# Patient Record
Sex: Female | Born: 1980 | Race: Black or African American | Hispanic: No | Marital: Single | Smoking: Current every day smoker
Health system: Southern US, Community
[De-identification: ages and names within clinical notes are randomized; demographics above are authoritative.]

## PROBLEM LIST (undated history)

## (undated) DIAGNOSIS — K219 Gastro-esophageal reflux disease without esophagitis: Secondary | ICD-10-CM

## (undated) HISTORY — DX: Gastro-esophageal reflux disease without esophagitis: K21.9

---

## 2003-08-17 ENCOUNTER — Emergency Department (HOSPITAL_COMMUNITY): Admission: EM | Admit: 2003-08-17 | Discharge: 2003-08-18 | Payer: Self-pay

## 2008-08-13 ENCOUNTER — Emergency Department (HOSPITAL_COMMUNITY): Admission: EM | Admit: 2008-08-13 | Discharge: 2008-08-13 | Payer: Self-pay | Admitting: Emergency Medicine

## 2010-10-12 LAB — URINALYSIS, ROUTINE W REFLEX MICROSCOPIC
Glucose, UA: NEGATIVE mg/dL
Nitrite: NEGATIVE
Specific Gravity, Urine: 1.013 (ref 1.005–1.030)
pH: 5.5 (ref 5.0–8.0)

## 2010-10-12 LAB — URINE MICROSCOPIC-ADD ON

## 2010-10-12 LAB — URINE CULTURE

## 2010-10-12 LAB — POCT PREGNANCY, URINE: Preg Test, Ur: NEGATIVE

## 2013-04-15 ENCOUNTER — Emergency Department (HOSPITAL_BASED_OUTPATIENT_CLINIC_OR_DEPARTMENT_OTHER)
Admission: EM | Admit: 2013-04-15 | Discharge: 2013-04-15 | Disposition: A | Discharge status: Home / Self Care | Payer: No Typology Code available for payment source | Attending: Emergency Medicine | Admitting: Emergency Medicine

## 2013-04-15 ENCOUNTER — Encounter (HOSPITAL_BASED_OUTPATIENT_CLINIC_OR_DEPARTMENT_OTHER): Payer: Self-pay | Admitting: Emergency Medicine

## 2013-04-15 DIAGNOSIS — Y92410 Unspecified street and highway as the place of occurrence of the external cause: Secondary | ICD-10-CM | POA: Insufficient documentation

## 2013-04-15 DIAGNOSIS — F17200 Nicotine dependence, unspecified, uncomplicated: Secondary | ICD-10-CM | POA: Insufficient documentation

## 2013-04-15 DIAGNOSIS — Y9389 Activity, other specified: Secondary | ICD-10-CM | POA: Insufficient documentation

## 2013-04-15 DIAGNOSIS — S0990XA Unspecified injury of head, initial encounter: Secondary | ICD-10-CM | POA: Insufficient documentation

## 2013-04-15 MED ORDER — IBUPROFEN 800 MG PO TABS: 800.0000 mg | ORAL_TABLET | Freq: Three times a day (TID) | ORAL | Status: DC

## 2013-04-15 MED ORDER — CYCLOBENZAPRINE HCL 10 MG PO TABS: 10.0000 mg | ORAL_TABLET | Freq: Three times a day (TID) | ORAL | Status: DC | PRN
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
## 2013-04-26 ENCOUNTER — Encounter (HOSPITAL_BASED_OUTPATIENT_CLINIC_OR_DEPARTMENT_OTHER): Payer: Self-pay | Admitting: Emergency Medicine

## 2013-04-26 ENCOUNTER — Emergency Department (HOSPITAL_BASED_OUTPATIENT_CLINIC_OR_DEPARTMENT_OTHER)
Admission: EM | Admit: 2013-04-26 | Discharge: 2013-04-26 | Disposition: A | Discharge status: Home / Self Care | Payer: Self-pay | Attending: Emergency Medicine | Admitting: Emergency Medicine

## 2013-04-26 DIAGNOSIS — K0889 Other specified disorders of teeth and supporting structures: Secondary | ICD-10-CM

## 2013-04-26 DIAGNOSIS — F17200 Nicotine dependence, unspecified, uncomplicated: Secondary | ICD-10-CM | POA: Insufficient documentation

## 2013-04-26 DIAGNOSIS — K089 Disorder of teeth and supporting structures, unspecified: Secondary | ICD-10-CM | POA: Insufficient documentation

## 2013-04-26 DIAGNOSIS — Z791 Long term (current) use of non-steroidal anti-inflammatories (NSAID): Secondary | ICD-10-CM | POA: Insufficient documentation

## 2013-04-26 DIAGNOSIS — K029 Dental caries, unspecified: Secondary | ICD-10-CM | POA: Insufficient documentation

## 2013-04-26 MED ORDER — AMOXICILLIN 500 MG PO CAPS: 500.0000 mg | ORAL_CAPSULE | Freq: Three times a day (TID) | ORAL | Status: DC

## 2013-04-26 NOTE — ED Notes (Signed)
Right lower toothache x 2 days. 

## 2013-04-26 NOTE — ED Provider Notes (Signed)
CSN: [id number]     Arrival date & time 04/26/13  1208 History   First MD Initiated Contact with Patient 04/26/13 1221     Chief Complaint  Patient presents with  . Dental Pain   (Consider location/radiation/quality/duration/timing/severity/associated sxs/prior Treatment) Patient is a 32 y.o. female presenting with tooth pain. The history is provided by the patient. No language interpreter was used.  Dental Pain Location:  Lower Lower teeth location:  31/RL 2nd molar Quality:  Dull Severity:  No pain Onset quality:  Sudden Timing:  Constant Progression:  Worsening Chronicity:  New Previous work-up:  Dental exam Worsened by:  Nothing tried Ineffective treatments:  None tried   History reviewed. No pertinent past medical history. History reviewed. No pertinent past surgical history. No family history on file. History  Substance Use Topics  . Smoking status: Current Every Day Smoker    Types: Cigarettes  . Smokeless tobacco: Not on file  . Alcohol Use: No   OB History   Grav Para Term Preterm Abortions TAB SAB Ect Mult Living                 Review of Systems  All other systems reviewed and are negative.    Allergies  Review of patient's allergies indicates no known allergies.  Home Medications   Current Outpatient Rx  Name  Route  Sig  Dispense  Refill  . TRAMADOL HCL PO   Oral   Take by mouth.         Marland Kitchen amoxicillin (AMOXIL) 500 MG capsule   Oral   Take 1 capsule (500 mg total) by mouth 3 (three) times daily.   21 capsule   0   . cyclobenzaprine (FLEXERIL) 10 MG tablet   Oral   Take 1 tablet (10 mg total) by mouth 3 (three) times daily as needed for muscle spasms.   30 tablet   0   . ibuprofen (ADVIL,MOTRIN) 800 MG tablet   Oral   Take 1 tablet (800 mg total) by mouth 3 (three) times daily.   21 tablet   0    BP 127/77  Pulse 74  Temp(Src) 98.4 F (36.9 C) (Oral)  Resp 16  Ht 5\' 4"  (1.626 m)  Wt 195 lb (88.451 kg)  BMI 33.46 kg/m2   SpO2 100%  LMP 03/31/2013 Physical Exam  Nursing note and vitals reviewed. Constitutional: She appears well-developed and well-nourished.  HENT:  Head: Normocephalic.  Right Ear: External ear normal.  Decayed tooth  Eyes: Pupils are equal, round, and reactive to light.  Neck: Normal range of motion.  Cardiovascular: Normal rate.   Pulmonary/Chest: Effort normal.  Musculoskeletal: Normal range of motion.  Neurological: She is alert.  Skin: Skin is warm.    ED Course  Procedures (including critical care time) Labs Review Labs Reviewed - No data to display Imaging Review No results found.  EKG Interpretation   None       MDM   1. Toothache    amoxicillian follow up with Dr. Tamela Gammon, PA-C 04/26/13 1304

## 2013-04-28 NOTE — ED Provider Notes (Signed)
Medical screening examination/treatment/procedure(s) were performed by non-physician practitioner and as supervising physician I was immediately available for consultation/collaboration.   Orlin Kann L Jayshon Dommer, MD 04/28/13 1101 

## 2013-07-29 ENCOUNTER — Encounter (HOSPITAL_BASED_OUTPATIENT_CLINIC_OR_DEPARTMENT_OTHER): Payer: Self-pay | Admitting: Emergency Medicine

## 2013-07-29 ENCOUNTER — Emergency Department (HOSPITAL_BASED_OUTPATIENT_CLINIC_OR_DEPARTMENT_OTHER)
Admission: EM | Admit: 2013-07-29 | Discharge: 2013-07-29 | Disposition: A | Discharge status: Home / Self Care | Payer: No Typology Code available for payment source | Attending: Emergency Medicine | Admitting: Emergency Medicine

## 2013-07-29 DIAGNOSIS — J329 Chronic sinusitis, unspecified: Secondary | ICD-10-CM | POA: Insufficient documentation

## 2013-07-29 DIAGNOSIS — H9209 Otalgia, unspecified ear: Secondary | ICD-10-CM | POA: Insufficient documentation

## 2013-07-29 DIAGNOSIS — R110 Nausea: Secondary | ICD-10-CM | POA: Insufficient documentation

## 2013-07-29 DIAGNOSIS — Z791 Long term (current) use of non-steroidal anti-inflammatories (NSAID): Secondary | ICD-10-CM | POA: Insufficient documentation

## 2013-07-29 DIAGNOSIS — F17200 Nicotine dependence, unspecified, uncomplicated: Secondary | ICD-10-CM | POA: Insufficient documentation

## 2013-07-29 LAB — RAPID STREP SCREEN (MED CTR MEBANE ONLY): Streptococcus, Group A Screen (Direct): NEGATIVE

## 2013-07-29 MED ORDER — AMOXICILLIN-POT CLAVULANATE 500-125 MG PO TABS: 1.0000 | ORAL_TABLET | Freq: Three times a day (TID) | ORAL | Status: DC

## 2013-07-29 NOTE — ED Provider Notes (Signed)
CSN: [id number]     Arrival date & time 07/29/13  0622 History   First MD Initiated Contact with Patient 07/29/13 [phone]     Chief Complaint  Patient presents with  . Sore Throat  . Nausea  . Otalgia   HPI Patient presents with history of sore throat associated with ear pain.  No definite fever.  No vomiting or diarrhea.  Has been using Alka-Seltzer plus. History reviewed. No pertinent past medical history. History reviewed. No pertinent past surgical history. History reviewed. No pertinent family history. History  Substance Use Topics  . Smoking status: Current Every Day Smoker    Types: Cigarettes  . Smokeless tobacco: Not on file  . Alcohol Use: No   OB History   Grav Para Term Preterm Abortions TAB SAB Ect Mult Living                 Review of Systems  HENT: Positive for ear pain.   All other systems reviewed and are negative.    Allergies  Review of patient's allergies indicates no known allergies.  Home Medications   Current Outpatient Rx  Name  Route  Sig  Dispense  Refill  . TRAMADOL HCL PO   Oral   Take by mouth.         Marland Kitchen. amoxicillin-clavulanate (AUGMENTIN) 500-125 MG per tablet   Oral   Take 1 tablet (500 mg total) by mouth every 8 (eight) hours.   21 tablet   0   . cyclobenzaprine (FLEXERIL) 10 MG tablet   Oral   Take 1 tablet (10 mg total) by mouth 3 (three) times daily as needed for muscle spasms.   30 tablet   0   . ibuprofen (ADVIL,MOTRIN) 800 MG tablet   Oral   Take 1 tablet (800 mg total) by mouth 3 (three) times daily.   21 tablet   0    LMP 07/01/2013 Physical Exam  Nursing note and vitals reviewed. Constitutional: She is oriented to person, place, and time. She appears well-developed and well-nourished. No distress.  HENT:  Head: Normocephalic and atraumatic.  Purulent posterior nasal drip  Eyes: Pupils are equal, round, and reactive to light.  Neck: Normal range of motion.  Cardiovascular: Normal rate and intact distal pulses.    Pulmonary/Chest: No respiratory distress.  Abdominal: Normal appearance. She exhibits no distension.  Musculoskeletal: Normal range of motion.  Neurological: She is alert and oriented to person, place, and time. No cranial nerve deficit.  Skin: Skin is warm and dry. No rash noted.  Psychiatric: She has a normal mood and affect. Her behavior is normal.    ED Course  Procedures (including critical care time) Labs Review Labs Reviewed  RAPID STREP SCREEN   Imaging Review No results found.  EKG Interpretation   None       MDM   1. Sinusitis        Nelia Shiobert L Mychelle Kendra, MD 07/29/13 [phone]

## 2013-07-29 NOTE — ED Notes (Addendum)
Pt states that she has pain to "left side of throat" - states last week pain was on the right side - initial symptoms began one week ago - reports nausea, pain to left ear. Reports taking OTC medications that have been ineffective. Pt also states she wants her throat checked for an STI. Denies known exposure or other systematic s/s.

## 2013-07-29 NOTE — Discharge Instructions (Signed)

## 2013-07-31 LAB — CULTURE, GROUP A STREP

## 2014-02-15 ENCOUNTER — Encounter (HOSPITAL_BASED_OUTPATIENT_CLINIC_OR_DEPARTMENT_OTHER): Payer: Self-pay | Admitting: Emergency Medicine

## 2014-02-15 ENCOUNTER — Emergency Department (HOSPITAL_BASED_OUTPATIENT_CLINIC_OR_DEPARTMENT_OTHER)
Admission: EM | Admit: 2014-02-15 | Discharge: 2014-02-15 | Disposition: A | Discharge status: Home / Self Care | Payer: No Typology Code available for payment source | Attending: Emergency Medicine | Admitting: Emergency Medicine

## 2014-02-15 ENCOUNTER — Emergency Department (HOSPITAL_BASED_OUTPATIENT_CLINIC_OR_DEPARTMENT_OTHER): Payer: No Typology Code available for payment source

## 2014-02-15 DIAGNOSIS — Z3202 Encounter for pregnancy test, result negative: Secondary | ICD-10-CM | POA: Diagnosis not present

## 2014-02-15 DIAGNOSIS — Z79899 Other long term (current) drug therapy: Secondary | ICD-10-CM | POA: Diagnosis not present

## 2014-02-15 DIAGNOSIS — K5900 Constipation, unspecified: Secondary | ICD-10-CM | POA: Insufficient documentation

## 2014-02-15 DIAGNOSIS — E669 Obesity, unspecified: Secondary | ICD-10-CM | POA: Insufficient documentation

## 2014-02-15 DIAGNOSIS — Z791 Long term (current) use of non-steroidal anti-inflammatories (NSAID): Secondary | ICD-10-CM | POA: Insufficient documentation

## 2014-02-15 DIAGNOSIS — R109 Unspecified abdominal pain: Secondary | ICD-10-CM

## 2014-02-15 DIAGNOSIS — R1032 Left lower quadrant pain: Secondary | ICD-10-CM | POA: Insufficient documentation

## 2014-02-15 DIAGNOSIS — F17200 Nicotine dependence, unspecified, uncomplicated: Secondary | ICD-10-CM | POA: Diagnosis not present

## 2014-02-15 DIAGNOSIS — R1013 Epigastric pain: Secondary | ICD-10-CM | POA: Insufficient documentation

## 2014-02-15 LAB — CBC WITH DIFFERENTIAL/PLATELET
BASOS ABS: 0 10*3/uL (ref 0.0–0.1)
Basophils Relative: 1 % (ref 0–1)
EOS PCT: 3 % (ref 0–5)
Eosinophils Absolute: 0.2 10*3/uL (ref 0.0–0.7)
HEMATOCRIT: 39.1 % (ref 36.0–46.0)
Hemoglobin: 13.5 g/dL (ref 12.0–15.0)
LYMPHS ABS: 1.4 10*3/uL (ref 0.7–4.0)
LYMPHS PCT: 22 % (ref 12–46)
MCH: 30.3 pg (ref 26.0–34.0)
MCHC: 34.5 g/dL (ref 30.0–36.0)
MCV: 87.9 fL (ref 78.0–100.0)
MONO ABS: 0.4 10*3/uL (ref 0.1–1.0)
Monocytes Relative: 6 % (ref 3–12)
NEUTROS ABS: 4.5 10*3/uL (ref 1.7–7.7)
Neutrophils Relative %: 69 % (ref 43–77)
PLATELETS: 270 10*3/uL (ref 150–400)
RBC: 4.45 MIL/uL (ref 3.87–5.11)
RDW: 12.5 % (ref 11.5–15.5)
WBC: 6.5 10*3/uL (ref 4.0–10.5)

## 2014-02-15 LAB — URINALYSIS, ROUTINE W REFLEX MICROSCOPIC
BILIRUBIN URINE: NEGATIVE
GLUCOSE, UA: NEGATIVE mg/dL
HGB URINE DIPSTICK: NEGATIVE
KETONES UR: NEGATIVE mg/dL
Leukocytes, UA: NEGATIVE
Nitrite: NEGATIVE
PH: 5 (ref 5.0–8.0)
PROTEIN: NEGATIVE mg/dL
Specific Gravity, Urine: 1.02 (ref 1.005–1.030)
Urobilinogen, UA: 0.2 mg/dL (ref 0.0–1.0)

## 2014-02-15 LAB — COMPREHENSIVE METABOLIC PANEL
ALT: 23 U/L (ref 0–35)
AST: 14 U/L (ref 0–37)
Albumin: 3.6 g/dL (ref 3.5–5.2)
Alkaline Phosphatase: 54 U/L (ref 39–117)
Anion gap: 11 (ref 5–15)
BUN: 13 mg/dL (ref 6–23)
CALCIUM: 9.9 mg/dL (ref 8.4–10.5)
CHLORIDE: 102 meq/L (ref 96–112)
CO2: 25 meq/L (ref 19–32)
Creatinine, Ser: 0.9 mg/dL (ref 0.50–1.10)
GFR, EST NON AFRICAN AMERICAN: 83 mL/min — AB (ref >90)
GLUCOSE: 94 mg/dL (ref 70–99)
Potassium: 4.3 mEq/L (ref 3.7–5.3)
SODIUM: 138 meq/L (ref 137–147)
Total Protein: 7.6 g/dL (ref 6.0–8.3)

## 2014-02-15 LAB — LIPASE, BLOOD: Lipase: 59 U/L (ref 11–59)

## 2014-02-15 LAB — PREGNANCY, URINE: Preg Test, Ur: NEGATIVE

## 2014-02-15 MED ORDER — ACETAMINOPHEN 500 MG PO TABS
1000.0000 mg | ORAL_TABLET | Freq: Once | ORAL | Status: AC
  Administered 2014-02-15: 1000 mg via ORAL
  Filled 2014-02-15: qty 2

## 2014-02-15 NOTE — Discharge Instructions (Signed)
Abdominal Pain, Women °Abdominal (stomach, pelvic, or belly) pain can be caused by many things. It is important to tell your doctor: °· The location of the pain. °· Does it come and go or is it present all the time? °· Are there things that start the pain (eating certain foods, exercise)? °· Are there other symptoms associated with the pain (fever, nausea, vomiting, diarrhea)? °All of this is helpful to know when trying to find the cause of the pain. °CAUSES  °· Stomach: virus or bacteria infection, or ulcer. °· Intestine: appendicitis (inflamed appendix), regional ileitis (Crohn's disease), ulcerative colitis (inflamed colon), irritable bowel syndrome, diverticulitis (inflamed diverticulum of the colon), or cancer of the stomach or intestine. °· Gallbladder disease or stones in the gallbladder. °· Kidney disease, kidney stones, or infection. °· Pancreas infection or cancer. °· Fibromyalgia (pain disorder). °· Diseases of the female organs: °¨ Uterus: fibroid (non-cancerous) tumors or infection. °¨ Fallopian tubes: infection or tubal pregnancy. °¨ Ovary: cysts or tumors. °¨ Pelvic adhesions (scar tissue). °¨ Endometriosis (uterus lining tissue growing in the pelvis and on the pelvic organs). °¨ Pelvic congestion syndrome (female organs filling up with blood just before the menstrual period). °¨ Pain with the menstrual period. °¨ Pain with ovulation (producing an egg). °¨ Pain with an IUD (intrauterine device, birth control) in the uterus. °¨ Cancer of the female organs. °· Functional pain (pain not caused by a disease, may improve without treatment). °· Psychological pain. °· Depression. °DIAGNOSIS  °Your doctor will decide the seriousness of your pain by doing an examination. °· Blood tests. °· X-rays. °· Ultrasound. °· CT scan (computed tomography, special type of X-ray). °· MRI (magnetic resonance imaging). °· Cultures, for infection. °· Barium enema (dye inserted in the large intestine, to better view it with  X-rays). °· Colonoscopy (looking in intestine with a lighted tube). °· Laparoscopy (minor surgery, looking in abdomen with a lighted tube). °· Major abdominal exploratory surgery (looking in abdomen with a large incision). °TREATMENT  °The treatment will depend on the cause of the pain.  °· Many cases can be observed and treated at home. °· Over-the-counter medicines recommended by your caregiver. °· Prescription medicine. °· Antibiotics, for infection. °· Birth control pills, for painful periods or for ovulation pain. °· Hormone treatment, for endometriosis. °· Nerve blocking injections. °· Physical therapy. °· Antidepressants. °· Counseling with a psychologist or psychiatrist. °· Minor or major surgery. °HOME CARE INSTRUCTIONS  °· Do not take laxatives, unless directed by your caregiver. °· Take over-the-counter pain medicine only if ordered by your caregiver. Do not take aspirin because it can cause an upset stomach or bleeding. °· Try a clear liquid diet (broth or water) as ordered by your caregiver. Slowly move to a bland diet, as tolerated, if the pain is related to the stomach or intestine. °· Have a thermometer and take your temperature several times a day, and record it. °· Bed rest and sleep, if it helps the pain. °· Avoid sexual intercourse, if it causes pain. °· Avoid stressful situations. °· Keep your follow-up appointments and tests, as your caregiver orders. °· If the pain does not go away with medicine or surgery, you may try: °¨ Acupuncture. °¨ Relaxation exercises (yoga, meditation). °¨ Group therapy. °¨ Counseling. °SEEK MEDICAL CARE IF:  °· You notice certain foods cause stomach pain. °· Your home care treatment is not helping your pain. °· You need stronger pain medicine. °· You want your IUD removed. °· You feel faint or   lightheaded. °· You develop nausea and vomiting. °· You develop a rash. °· You are having side effects or an allergy to your medicine. °SEEK IMMEDIATE MEDICAL CARE IF:  °· Your  pain does not go away or gets worse. °· You have a fever. °· Your pain is felt only in portions of the abdomen. The right side could possibly be appendicitis. The left lower portion of the abdomen could be colitis or diverticulitis. °· You are passing blood in your stools (bright red or black tarry stools, with or without vomiting). °· You have blood in your urine. °· You develop chills, with or without a fever. °· You pass out. °MAKE SURE YOU:  °· Understand these instructions. °· Will watch your condition. °· Will get help right away if you are not doing well or get worse. °Document Released: 04/10/2007 Document Revised: 10/28/2013 Document Reviewed: 04/30/2009 °[employer]® Patient Information ©2015 [employer]. This information is not intended to replace advice given to you by your health care provider. Make sure you discuss any questions you have with your health care provider. ° °

## 2014-02-15 NOTE — ED Notes (Signed)
Patient here with abdominal discomfort since early am. Patient thinks she may be constipated but reports normal BM yesterday. Thinks she ate something at restaurant that doesn't agree with her, no nausea, no vomiting

## 2014-02-15 NOTE — ED Provider Notes (Signed)
CSN: [id number]     Arrival date & time 02/15/14  [id number] History   First MD Initiated Contact with Patient 02/15/14 1011     Chief Complaint  Patient presents with  . Constipation     (Consider location/radiation/quality/duration/timing/severity/associated sxs/prior Treatment) HPI 33 year old female presents with abdominal pain since this morning. She states it feels like she is mostly uncomfortable and is concerned she is constipated. She's been having normal bowel movements for the past week or more. She had a normal bowel movement yesterday. She states she tried to go the bathroom today but nothing would come out. No nausea or vomiting. The pain is in her epigastrium. She states it sometimes is a sharp type pain. She tried MiraLAX without any relief this morning. No fevers. No urinary or vaginal symptoms. Rates the pain as a 5 currently.  History reviewed. No pertinent past medical history. History reviewed. No pertinent past surgical history. No family history on file. History  Substance Use Topics  . Smoking status: Current Every Day Smoker    Types: Cigarettes  . Smokeless tobacco: Not on file  . Alcohol Use: No   OB History   Grav Para Term Preterm Abortions TAB SAB Ect Mult Living                 Review of Systems  Constitutional: Negative for fever.  Gastrointestinal: Positive for abdominal pain. Negative for nausea, vomiting and diarrhea.  Genitourinary: Negative for dysuria, vaginal bleeding and vaginal discharge.  All other systems reviewed and are negative.     Allergies  Review of patient's allergies indicates no known allergies.  Home Medications   Prior to Admission medications   Medication Sig Start Date End Date Taking? Authorizing Provider  buPROPion (WELLBUTRIN XL) 300 MG 24 hr tablet Take 300 mg by mouth daily.   Yes Historical Provider, MD  gabapentin (NEURONTIN) 300 MG capsule Take 300 mg by mouth at bedtime.   Yes Historical Provider, MD  metFORMIN  (GLUCOPHAGE) 500 MG tablet Take 500 mg by mouth daily with breakfast.   Yes Historical Provider, MD  ibuprofen (ADVIL,MOTRIN) 800 MG tablet Take 1 tablet (800 mg total) by mouth 3 (three) times daily. 04/15/13   Charles B. Bernette MayersSheldon, MD  TRAMADOL HCL PO Take by mouth.    Historical Provider, MD   BP 157/102  Pulse 96  Temp(Src) 98.4 F (36.9 C) (Oral)  Resp 18  Ht 5\' 4"  (1.626 m)  Wt 227 lb (102.967 kg)  BMI 38.95 kg/m2  SpO2 100%  LMP 02/02/2014 Physical Exam  Nursing note and vitals reviewed. Constitutional: She is oriented to person, place, and time. She appears well-developed and well-nourished. No distress.  Obese. Currently sitting up and looking at phone  HENT:  Head: Normocephalic and atraumatic.  Right Ear: External ear normal.  Left Ear: External ear normal.  Nose: Nose normal.  Eyes: Right eye exhibits no discharge. Left eye exhibits no discharge.  Cardiovascular: Normal rate, regular rhythm and normal heart sounds.   Pulmonary/Chest: Effort normal and breath sounds normal.  Abdominal: Soft. There is tenderness in the epigastric area and left upper quadrant.  Neurological: She is alert and oriented to person, place, and time.  Skin: Skin is warm and dry.    ED Course  Procedures (including critical care time) Labs Review Labs Reviewed  COMPREHENSIVE METABOLIC PANEL - Abnormal; Notable for the following:    Total Bilirubin <0.2 (*)    GFR calc non Af Amer 83 (*)  All other components within normal limits  CBC WITH DIFFERENTIAL  LIPASE, BLOOD  URINALYSIS, ROUTINE W REFLEX MICROSCOPIC  PREGNANCY, URINE    Imaging Review Dg Abd Acute W/chest  02/15/2014   CLINICAL DATA:  Epigastric pain today.  EXAM: ACUTE ABDOMEN SERIES (ABDOMEN 2 VIEW & CHEST 1 VIEW)  COMPARISON:  Lumbar spine radiographs 12/10/2013 and 03/22/2009. Chest radiographs 04/25/2005.  FINDINGS: The heart size and mediastinal contours are normal. The lungs are clear. There is no pleural effusion or  pneumothorax. No acute osseous findings are identified.  The bowel gas pattern is normal. There is no free intraperitoneal air. Multiple pelvic calcifications are present, grossly unchanged from 2010. No acute osseous findings are seen.  IMPRESSION: No active cardiopulmonary or abdominal process. Grossly stable pelvic calcifications.   Electronically Signed   By: Roxy Horseman M.D.   On: 02/15/2014 12:01     EKG Interpretation None      MDM   Final diagnoses:  Abdominal pain, unspecified abdominal location    Patient's abdominal exam is benign. Well appearing here, no vomiting and pain has improved with tylenol. I highly doubt cholecystitis, pancreatitis, SBO or acute infection. Will treat symptomatically at home with tylenol and return if pain worsens or no bowel movement.    Audree Camel, MD 02/15/14 1447

## 2014-09-09 ENCOUNTER — Emergency Department (HOSPITAL_BASED_OUTPATIENT_CLINIC_OR_DEPARTMENT_OTHER)
Admission: EM | Admit: 2014-09-09 | Discharge: 2014-09-09 | Disposition: A | Discharge status: Home / Self Care | Payer: No Typology Code available for payment source | Attending: Emergency Medicine | Admitting: Emergency Medicine

## 2014-09-09 ENCOUNTER — Encounter (HOSPITAL_BASED_OUTPATIENT_CLINIC_OR_DEPARTMENT_OTHER): Payer: Self-pay

## 2014-09-09 DIAGNOSIS — K2970 Gastritis, unspecified, without bleeding: Secondary | ICD-10-CM | POA: Insufficient documentation

## 2014-09-09 DIAGNOSIS — Z720 Tobacco use: Secondary | ICD-10-CM | POA: Insufficient documentation

## 2014-09-09 DIAGNOSIS — Z79899 Other long term (current) drug therapy: Secondary | ICD-10-CM | POA: Insufficient documentation

## 2014-09-09 DIAGNOSIS — Z791 Long term (current) use of non-steroidal anti-inflammatories (NSAID): Secondary | ICD-10-CM | POA: Insufficient documentation

## 2014-09-09 LAB — URINE MICROSCOPIC-ADD ON

## 2014-09-09 LAB — CBC WITH DIFFERENTIAL/PLATELET
BASOS ABS: 0 10*3/uL (ref 0.0–0.1)
Basophils Relative: 1 % (ref 0–1)
EOS ABS: 0.2 10*3/uL (ref 0.0–0.7)
EOS PCT: 3 % (ref 0–5)
HEMATOCRIT: 41.6 % (ref 36.0–46.0)
HEMOGLOBIN: 14.2 g/dL (ref 12.0–15.0)
LYMPHS PCT: 29 % (ref 12–46)
Lymphs Abs: 1.9 10*3/uL (ref 0.7–4.0)
MCH: 29.8 pg (ref 26.0–34.0)
MCHC: 34.1 g/dL (ref 30.0–36.0)
MCV: 87.2 fL (ref 78.0–100.0)
MONOS PCT: 7 % (ref 3–12)
Monocytes Absolute: 0.5 10*3/uL (ref 0.1–1.0)
NEUTROS ABS: 4 10*3/uL (ref 1.7–7.7)
Neutrophils Relative %: 60 % (ref 43–77)
Platelets: 240 10*3/uL (ref 150–400)
RBC: 4.77 MIL/uL (ref 3.87–5.11)
RDW: 12.9 % (ref 11.5–15.5)
WBC: 6.6 10*3/uL (ref 4.0–10.5)

## 2014-09-09 LAB — PREGNANCY, URINE: PREG TEST UR: NEGATIVE

## 2014-09-09 LAB — URINALYSIS, ROUTINE W REFLEX MICROSCOPIC
Bilirubin Urine: NEGATIVE
GLUCOSE, UA: NEGATIVE mg/dL
Hgb urine dipstick: NEGATIVE
KETONES UR: NEGATIVE mg/dL
NITRITE: NEGATIVE
PH: 5.5 (ref 5.0–8.0)
PROTEIN: NEGATIVE mg/dL
SPECIFIC GRAVITY, URINE: 1.024 (ref 1.005–1.030)
UROBILINOGEN UA: 0.2 mg/dL (ref 0.0–1.0)

## 2014-09-09 LAB — COMPREHENSIVE METABOLIC PANEL
ALBUMIN: 3.8 g/dL (ref 3.5–5.2)
ALK PHOS: 51 U/L (ref 39–117)
ALT: 17 U/L (ref 0–35)
ANION GAP: 8 (ref 5–15)
AST: 18 U/L (ref 0–37)
BILIRUBIN TOTAL: 0.3 mg/dL (ref 0.3–1.2)
BUN: 11 mg/dL (ref 6–23)
CO2: 25 mmol/L (ref 19–32)
Calcium: 9 mg/dL (ref 8.4–10.5)
Chloride: 104 mmol/L (ref 96–112)
Creatinine, Ser: 0.89 mg/dL (ref 0.50–1.10)
GFR calc Af Amer: >90 mL/min (ref >90)
GFR calc non Af Amer: 84 mL/min — ABNORMAL LOW (ref >90)
Glucose, Bld: 98 mg/dL (ref 70–99)
POTASSIUM: 3.9 mmol/L (ref 3.5–5.1)
Sodium: 137 mmol/L (ref 135–145)
TOTAL PROTEIN: 7.6 g/dL (ref 6.0–8.3)

## 2014-09-09 LAB — LIPASE, BLOOD: Lipase: 59 U/L (ref 11–59)

## 2014-09-09 MED ORDER — SUCRALFATE 1 G PO TABS: 1.0000 g | ORAL_TABLET | Freq: Three times a day (TID) | ORAL | Status: AC

## 2014-09-09 MED ORDER — PANTOPRAZOLE SODIUM 40 MG PO TBEC
40.0000 mg | DELAYED_RELEASE_TABLET | Freq: Every day | ORAL | Status: AC
Start: 2014-09-09 — End: ?

## 2014-09-09 MED ORDER — ONDANSETRON 4 MG PO TBDP
4.0000 mg | ORAL_TABLET | Freq: Once | ORAL | Status: AC
  Administered 2014-09-09: 4 mg via ORAL
  Filled 2014-09-09: qty 1

## 2014-09-09 MED ORDER — GI COCKTAIL ~~LOC~~
30.0000 mL | Freq: Once | ORAL | Status: AC
  Administered 2014-09-09: 30 mL via ORAL
  Filled 2014-09-09: qty 30

## 2014-09-09 MED ORDER — PANTOPRAZOLE SODIUM 40 MG PO TBEC
40.0000 mg | DELAYED_RELEASE_TABLET | Freq: Once | ORAL | Status: AC
Start: 2014-09-09 — End: 2014-09-09
  Administered 2014-09-09: 40 mg via ORAL
  Filled 2014-09-09: qty 1

## 2014-09-09 MED ORDER — HYDROCODONE-ACETAMINOPHEN 5-325 MG PO TABS: 1.0000 | ORAL_TABLET | ORAL | Status: DC | PRN

## 2014-09-09 MED ORDER — ONDANSETRON 4 MG PO TBDP: 4.0000 mg | ORAL_TABLET | Freq: Three times a day (TID) | ORAL | Status: DC | PRN

## 2014-09-09 NOTE — Discharge Instructions (Signed)

## 2014-09-09 NOTE — ED Notes (Signed)
Pt ambulates to room, reports upper Abd pain intermittemt x 1 week.  Nausea on 3/12 followed by 1 episode of vomiting. Not vomited since.  Pain persists worsens at night.

## 2014-09-09 NOTE — ED Provider Notes (Signed)
TIME SEEN: 11:20 AM  CHIEF COMPLAINT: Abdominal pain, nausea and vomiting  HPI: Pt is a 34 y.o. female with history of diabetes who presents to the emergency department with complaints of epigastric achy abdominal pain without radiation that is moderate in nature this been present for 5 days intermittently. Denies aggravating or relieving factors. Had one episode of nonbloody, nonbilious vomiting 2 days ago. No fevers, chills, diarrhea, bloody stools or melena, dysuria or hematuria, vaginal bleeding or discharge. Last menstrual period was February 13. No prior abdominal surgeries. Has never had similar pain. No sick contacts or recent travel.  ROS: See HPI Constitutional: no fever  Eyes: no drainage  ENT: no runny nose   Cardiovascular:  no chest pain  Resp: no SOB  GI: vomiting GU: no dysuria Integumentary: no rash  Allergy: no hives  Musculoskeletal: no leg swelling  Neurological: no slurred speech ROS otherwise negative  PAST MEDICAL HISTORY/PAST SURGICAL HISTORY:  No past medical history on file.  MEDICATIONS:  Prior to Admission medications   Medication Sig Start Date End Date Taking? Authorizing Provider  metFORMIN (GLUCOPHAGE) 500 MG tablet Take 1,000 mg by mouth daily with breakfast.    Yes Historical Provider, MD  buPROPion (WELLBUTRIN XL) 300 MG 24 hr tablet Take 300 mg by mouth daily.    Historical Provider, MD  gabapentin (NEURONTIN) 300 MG capsule Take 300 mg by mouth at bedtime.    Historical Provider, MD  ibuprofen (ADVIL,MOTRIN) 800 MG tablet Take 1 tablet (800 mg total) by mouth 3 (three) times daily. 04/15/13   Susy Frizzleharles Sheldon, MD  TRAMADOL HCL PO Take by mouth.    Historical Provider, MD    ALLERGIES:  No Known Allergies  SOCIAL HISTORY:  History  Substance Use Topics  . Smoking status: Current Every Day Smoker    Types: Cigarettes  . Smokeless tobacco: Not on file  . Alcohol Use: No    FAMILY HISTORY: No family history on file.  EXAM: BP 140/82  mmHg  Pulse 80  Temp(Src) 98 F (36.7 C) (Oral)  Resp 18  Ht 5\' 4"  (1.626 m)  Wt 238 lb (107.956 kg)  BMI 40.83 kg/m2  SpO2 100% CONSTITUTIONAL: Alert and oriented and responds appropriately to questions. Well-appearing; well-nourished HEAD: Normocephalic EYES: Conjunctivae clear, PERRL ENT: normal nose; no rhinorrhea; moist mucous membranes; pharynx without lesions noted NECK: Supple, no meningismus, no LAD  CARD: RRR; S1 and S2 appreciated; no murmurs, no clicks, no rubs, no gallops RESP: Normal chest excursion without splinting or tachypnea; breath sounds clear and equal bilaterally; no wheezes, no rhonchi, no rales,  ABD/GI: Normal bowel sounds; non-distended; soft, mildly tender to palpation in the epigastric region without guarding or rebound, negative Murphy sign, no tenderness at McBurney's point, non-peritoneal abdomen BACK:  The back appears normal and is non-tender to palpation, there is no CVA tenderness EXT: Normal ROM in all joints; non-tender to palpation; no edema; normal capillary refill; no cyanosis    SKIN: Normal color for age and race; warm NEURO: Moves all extremities equally PSYCH: The patient's mood and manner are appropriate. Grooming and personal hygiene are appropriate.  MEDICAL DECISION MAKING: Patient here with epigastric tenderness. Likely gastritis, GERD, non-perforated ulcer. Cholecystitis and pancreatitis or on the differential although I feel this is less likely. Will obtain abdominal labs, urine pregnancy. We'll give GI cocktail, Protonix and Zofran and reassess. At this point I do not feel she needs abdominal imaging given her abdominal exam is relatively benign and she is nontoxic-appearing,  afebrile.  ED PROGRESS: Labs are unremarkable including no leukocytosis, normal lipase, normal LFTs. She is afebrile, nontoxic. Doubt pancreatitis or cholecystitis. Patient reports some improvement in pain after above medications but is still complaining of pain. Her  abdominal exam though is still relatively benign. I do not feel she needs abdominal imaging this time. I think this is likely gastritis and will discharge on Protonix, Carafate, Zofran and also give prescription for Vicodin. Have advised patient to avoid NSAIDs, alcohol, spicy, acidic and greasy foods. Will give outpatient gastroenterology follow-up information. Discussed return precautions. She verbalized understanding and is comfortable with plan.     Layla Maw Kamir Selover, DO 09/09/14 1236

## 2014-09-24 ENCOUNTER — Encounter: Payer: Self-pay | Admitting: Family Medicine

## 2014-09-24 ENCOUNTER — Ambulatory Visit: Payer: Self-pay | Attending: Family Medicine | Admitting: Family Medicine

## 2014-09-24 VITALS — BP 118/81 | HR 78 | Temp 98.2°F | Resp 16 | Ht 64.0 in | Wt 236.0 lb

## 2014-09-24 DIAGNOSIS — E282 Polycystic ovarian syndrome: Secondary | ICD-10-CM | POA: Insufficient documentation

## 2014-09-24 DIAGNOSIS — F17200 Nicotine dependence, unspecified, uncomplicated: Secondary | ICD-10-CM | POA: Insufficient documentation

## 2014-09-24 DIAGNOSIS — K5900 Constipation, unspecified: Secondary | ICD-10-CM | POA: Insufficient documentation

## 2014-09-24 DIAGNOSIS — Z6841 Body Mass Index (BMI) 40.0 and over, adult: Secondary | ICD-10-CM | POA: Insufficient documentation

## 2014-09-24 DIAGNOSIS — E669 Obesity, unspecified: Secondary | ICD-10-CM | POA: Insufficient documentation

## 2014-09-24 DIAGNOSIS — K5901 Slow transit constipation: Secondary | ICD-10-CM

## 2014-09-24 DIAGNOSIS — R1013 Epigastric pain: Secondary | ICD-10-CM | POA: Insufficient documentation

## 2014-09-24 DIAGNOSIS — K2970 Gastritis, unspecified, without bleeding: Secondary | ICD-10-CM | POA: Insufficient documentation

## 2014-09-24 MED ORDER — METFORMIN HCL 500 MG PO TABS: 1000.0000 mg | ORAL_TABLET | Freq: Every day | ORAL | Status: DC

## 2014-09-24 NOTE — Assessment & Plan Note (Signed)
PCOS: refilled metformin.

## 2014-09-24 NOTE — Progress Notes (Signed)
Establish Care HFU 

## 2014-09-24 NOTE — Assessment & Plan Note (Addendum)
A: Abdominal pain in epigastric area: gastritis vs biliary colic P: ordered abdominal ultrasound to evaluate for gallstones.  Advised avoidance of THC

## 2014-09-24 NOTE — Progress Notes (Signed)
   Subjective:    Patient ID: Kathleen Bryan, female    DOB: Jul 19, 1980, 34 y.o.   MRN: [id number] ED f/u gastritis, PCOS  HPI  1. Epigastric pain: x 1 years. Worsening. Pain after eating. Was taking NSAIDs to help pain. Went to ED has normal AB x-ray and labs. Started on protonix and carafate. Pain a bit better. Denies ETOH. Smokes THC rarely. Taking metformin for PCOS. Took an extended break from metformin of 6 weeks but still had pain.  2. PCOS: dx last year. Has period q month. 4 days. Light. Has facial hair. Is obese. No children.   Soc Hx: smokes 1/2 PPD x 5 years. THC 1-2 times per month.  Med Hx: PCOS Surg Hx: negative Fam Hx: gastritis on father side    Review of Systems  Constitutional: Negative for fever, chills and unexpected weight change.  Respiratory: Negative for shortness of breath.   Cardiovascular: Negative for chest pain.  Gastrointestinal: Positive for abdominal pain and constipation.  Endocrine: Negative.   Genitourinary: Positive for menstrual problem.  Neurological: Negative.   Hematological: Negative.   Psychiatric/Behavioral: Negative.        Objective:   Physical Exam BP 118/81 mmHg  Pulse 78  Temp(Src) 98.2 F (36.8 C) (Oral)  Resp 16  Ht 5\' 4"  (1.626 m)  Wt 236 lb (107.049 kg)  BMI 40.49 kg/m2  SpO2 99%  LMP 09/02/2014 General appearance: alert, cooperative and no distress Head: Normocephalic, without obvious abnormality, atraumatic Neck: no adenopathy, supple, symmetrical, trachea midline and thyroid not enlarged, symmetric, no tenderness/mass/nodules Lungs: clear to auscultation bilaterally Heart: regular rate and rhythm, S1, S2 normal, no murmur, click, rub or gallop Abdomen: soft, non-tender; bowel sounds normal; no masses,  no organomegaly Extremities: extremities normal, atraumatic, no cyanosis or edema Pulses: 2+ and symmetric Skin: Skin color, texture, turgor normal. No rashes or lesions, evidence of facial hair on chin          Assessment & Plan:

## 2014-09-24 NOTE — Patient Instructions (Addendum)
Ms. Kathleen Bryan,  Thank you for coming in today.  1. Abdominal pain: ordered abdominal ultrasound to evaluate for gallstones.   2. PCOS: refilled metformin.  Please apply for [place] discount and orange card, you can also inquire if any of your medications are on the PASS (medications assistance) list.   F/u in 6 weeks for epigastric pain/gastritis   Dr. Armen PickupFunches

## 2014-10-10 ENCOUNTER — Ambulatory Visit (HOSPITAL_COMMUNITY): Payer: Self-pay

## 2014-10-22 ENCOUNTER — Ambulatory Visit: Payer: No Typology Code available for payment source

## 2017-07-07 ENCOUNTER — Encounter (HOSPITAL_BASED_OUTPATIENT_CLINIC_OR_DEPARTMENT_OTHER): Payer: Self-pay | Admitting: *Deleted

## 2017-07-07 ENCOUNTER — Emergency Department (HOSPITAL_BASED_OUTPATIENT_CLINIC_OR_DEPARTMENT_OTHER): Payer: Self-pay

## 2017-07-07 ENCOUNTER — Emergency Department (HOSPITAL_BASED_OUTPATIENT_CLINIC_OR_DEPARTMENT_OTHER)
Admission: EM | Admit: 2017-07-07 | Discharge: 2017-07-07 | Disposition: A | Discharge status: Home / Self Care | Payer: Self-pay | Attending: Emergency Medicine | Admitting: Emergency Medicine

## 2017-07-07 ENCOUNTER — Other Ambulatory Visit: Payer: Self-pay

## 2017-07-07 DIAGNOSIS — Z7984 Long term (current) use of oral hypoglycemic drugs: Secondary | ICD-10-CM | POA: Insufficient documentation

## 2017-07-07 DIAGNOSIS — F17210 Nicotine dependence, cigarettes, uncomplicated: Secondary | ICD-10-CM | POA: Insufficient documentation

## 2017-07-07 DIAGNOSIS — Z79899 Other long term (current) drug therapy: Secondary | ICD-10-CM | POA: Insufficient documentation

## 2017-07-07 DIAGNOSIS — R079 Chest pain, unspecified: Secondary | ICD-10-CM | POA: Insufficient documentation

## 2017-07-07 LAB — BASIC METABOLIC PANEL
Anion gap: 8 (ref 5–15)
BUN: 12 mg/dL (ref 6–20)
CHLORIDE: 102 mmol/L (ref 101–111)
CO2: 27 mmol/L (ref 22–32)
CREATININE: 1 mg/dL (ref 0.44–1.00)
Calcium: 8.7 mg/dL — ABNORMAL LOW (ref 8.9–10.3)
GFR calc non Af Amer: >60 mL/min (ref >60)
Glucose, Bld: 111 mg/dL — ABNORMAL HIGH (ref 65–99)
Potassium: 3.7 mmol/L (ref 3.5–5.1)
SODIUM: 137 mmol/L (ref 135–145)

## 2017-07-07 LAB — CBC
HCT: 39.9 % (ref 36.0–46.0)
Hemoglobin: 13.8 g/dL (ref 12.0–15.0)
MCH: 30.1 pg (ref 26.0–34.0)
MCHC: 34.6 g/dL (ref 30.0–36.0)
MCV: 87.1 fL (ref 78.0–100.0)
PLATELETS: 266 10*3/uL (ref 150–400)
RBC: 4.58 MIL/uL (ref 3.87–5.11)
RDW: 12.7 % (ref 11.5–15.5)
WBC: 5.9 10*3/uL (ref 4.0–10.5)

## 2017-07-07 LAB — TROPONIN I

## 2017-07-07 NOTE — ED Triage Notes (Signed)
Chest pain x 3 days. Pain is an uncomfortable feeling in her left upper chest.

## 2017-07-07 NOTE — ED Provider Notes (Signed)
[facility] EMERGENCY DEPARTMENT Provider Note   CSN: [id number] Arrival date & time: 07/07/17  1158     History   Chief Complaint Chief Complaint  Patient presents with  . Chest Pain    HPI Kathleen Bryan is a 37 y.o. female.  HPI Patient presents with left upper chest pain for last 3 days.  Initially came and went now constant.  Not worse with breathing.  It is dull.  Worse with movements.  No trauma.  No shortness of breath.  No swelling in her legs.  She does smoke cigarettes.  Has not had pains like this before.  No trauma.  No abdominal pain.  Pain is mild to moderate. Past Medical History:  Diagnosis Date  . GERD (gastroesophageal reflux disease) Dx 2016    Patient Active Problem List   Diagnosis Date Noted  . Gastritis 09/24/2014  . Severe obesity (BMI >= 40) (HCC) 09/24/2014  . Abdominal pain, epigastric 09/24/2014  . Constipation 09/24/2014  . PCOS (polycystic ovarian syndrome) 09/24/2014    History reviewed. No pertinent surgical history.  OB History    Obstetric Comments   Hx pregnancy no living children. Hx polycystic ovarian syndrome       Home Medications    Prior to Admission medications   Medication Sig Start Date End Date Taking? Authorizing Provider  HYDROcodone-acetaminophen (NORCO/VICODIN) 5-325 MG per tablet Take 1 tablet by mouth every 4 (four) hours as needed. 09/09/14   Ward, Layla MawKristen N, DO  metFORMIN (GLUCOPHAGE) 500 MG tablet Take 2 tablets (1,000 mg total) by mouth daily with breakfast. 09/24/14   Funches, Gerilyn NestleJosalyn, MD  pantoprazole (PROTONIX) 40 MG tablet Take 1 tablet (40 mg total) by mouth daily. 09/09/14   Ward, Layla MawKristen N, DO  sucralfate (CARAFATE) 1 G tablet Take 1 tablet (1 g total) by mouth 4 (four) times daily -  with meals and at bedtime. 09/09/14   Ward, Layla MawKristen N, DO    Family History Family History  Problem Relation Age of Onset  . Diabetes Maternal Aunt     Social History Social History   Tobacco Use  .  Smoking status: Current Every Day Smoker    Types: Cigarettes  . Smokeless tobacco: Never Used  Substance Use Topics  . Alcohol use: No  . Drug use: No     Allergies   Patient has no known allergies.   Review of Systems Review of Systems  Constitutional: Negative for appetite change, chills and fever.  HENT: Negative for congestion.   Respiratory: Negative for chest tightness and shortness of breath.   Cardiovascular: Positive for chest pain.  Gastrointestinal: Negative for abdominal distention and abdominal pain.  Genitourinary: Negative for dysuria.  Musculoskeletal: Negative for back pain.  Neurological: Negative for tremors and syncope.  Hematological: Negative for adenopathy.  Psychiatric/Behavioral: Negative for confusion.     Physical Exam Updated Vital Signs BP 108/63   Pulse 68   Temp 98.2 F (36.8 C) (Oral)   Resp (!) 24   Ht 5\' 4"  (1.626 m)   LMP 06/30/2017   SpO2 100%   BMI 40.51 kg/m   Physical Exam  Constitutional: She appears well-developed.  HENT:  Head: Atraumatic.  Eyes: Pupils are equal, round, and reactive to light.  Neck: Neck supple.  Cardiovascular: Normal rate and regular rhythm.  Pulmonary/Chest: Effort normal and breath sounds normal.  Tenderness to left upper chest wall and left trapezius area.  No rash.  No deformity.  Lungs are clear.  Abdominal:  Soft. There is no tenderness.  Musculoskeletal:       Right lower leg: She exhibits no edema.       Left lower leg: She exhibits no edema.  Neurological: She is alert.  Skin: Skin is warm. Capillary refill takes less than 2 seconds.     ED Treatments / Results  Labs (all labs ordered are listed, but only abnormal results are displayed) Labs Reviewed  BASIC METABOLIC PANEL - Abnormal; Notable for the following components:      Result Value   Glucose, Bld 111 (*)    Calcium 8.7 (*)    All other components within normal limits  CBC  TROPONIN I    EKG  EKG  Interpretation  Date/Time:  Friday July 07 2017 12:07:20 EST Ventricular Rate:  80 PR Interval:  136 QRS Duration: 76 QT Interval:  380 QTC Calculation: 438 R Axis:   59 Text Interpretation:  Normal sinus rhythm Normal ECG Confirmed by Benjiman Core [phone]) on 07/07/2017 2:59:57 PM       Radiology Dg Chest 2 View  Result Date: 07/07/2017 CLINICAL DATA:  Chest pain EXAM: CHEST  2 VIEW COMPARISON:  February 15, 2014 FINDINGS: There is no edema or consolidation. The heart size and pulmonary vascularity are normal. No adenopathy. No pneumothorax. No bone lesions. IMPRESSION: No edema or consolidation. Electronically Signed   By: Bretta Bang III M.D.   On: 07/07/2017 14:31    Procedures Procedures (including critical care time)  Medications Ordered in ED Medications - No data to display   Initial Impression / Assessment and Plan / ED Course  I have reviewed the triage vital signs and the nursing notes.  Pertinent labs & imaging results that were available during my care of the patient were reviewed by me and considered in my medical decision making (see chart for details).     Patient with chest pain.  Low cardiac risk.  Doubt pulmonary embolism.  X-rays EKG and lab work reassuring.  Will discharge home.  Final Clinical Impressions(s) / ED Diagnoses   Final diagnoses:  Nonspecific chest pain    ED Discharge Orders    None       Benjiman Core, MD 07/07/17 1514

## 2017-09-05 ENCOUNTER — Other Ambulatory Visit: Payer: Self-pay

## 2017-09-05 ENCOUNTER — Emergency Department (HOSPITAL_BASED_OUTPATIENT_CLINIC_OR_DEPARTMENT_OTHER)
Admission: EM | Admit: 2017-09-05 | Discharge: 2017-09-05 | Disposition: A | Discharge status: Home / Self Care | Payer: Self-pay | Attending: Emergency Medicine | Admitting: Emergency Medicine

## 2017-09-05 ENCOUNTER — Encounter (HOSPITAL_BASED_OUTPATIENT_CLINIC_OR_DEPARTMENT_OTHER): Payer: Self-pay | Admitting: Emergency Medicine

## 2017-09-05 DIAGNOSIS — Z79899 Other long term (current) drug therapy: Secondary | ICD-10-CM | POA: Insufficient documentation

## 2017-09-05 DIAGNOSIS — K029 Dental caries, unspecified: Secondary | ICD-10-CM

## 2017-09-05 DIAGNOSIS — F17210 Nicotine dependence, cigarettes, uncomplicated: Secondary | ICD-10-CM | POA: Insufficient documentation

## 2017-09-05 DIAGNOSIS — Z7984 Long term (current) use of oral hypoglycemic drugs: Secondary | ICD-10-CM | POA: Insufficient documentation

## 2017-09-05 MED ORDER — TRAMADOL HCL 50 MG PO TABS: 50.0000 mg | ORAL_TABLET | Freq: Four times a day (QID) | ORAL | 0 refills | Status: DC | PRN

## 2017-09-05 MED ORDER — AMOXICILLIN 500 MG PO CAPS: 500.0000 mg | ORAL_CAPSULE | Freq: Three times a day (TID) | ORAL | 0 refills | Status: DC

## 2017-09-05 MED ORDER — IBUPROFEN 400 MG PO TABS
400.0000 mg | ORAL_TABLET | Freq: Once | ORAL | Status: AC | PRN
  Administered 2017-09-05: 400 mg via ORAL
  Filled 2017-09-05: qty 1

## 2017-09-05 NOTE — ED Triage Notes (Signed)
L side dental pain. Pt reports 2 broken teeth.

## 2017-09-05 NOTE — ED Provider Notes (Signed)
[facility] EMERGENCY DEPARTMENT Provider Note   CSN: [id number] Arrival date & time: 09/05/17  1826     History   Chief Complaint Chief Complaint  Patient presents with  . Dental Pain    HPI Kathleen Bryan is a 37 y.o. female.  Patient is a 37 year old female with a history of gastroesophageal reflux disease who presents with tooth pain.  She states she has had problems intermittently with her left upper and lower back molars for about a year.  However over the last 2-3 days she has had increasing pain and some feeling that her left face is swollen.  She states the pain radiates up toward her eye and toward her ear.  She denies any known fevers.  No nausea or vomiting.  She currently does not have a dentist nor does she have insurance to see a dentist.  She is been taking Tylenol without improvement in symptoms.      Past Medical History:  Diagnosis Date  . GERD (gastroesophageal reflux disease) Dx 2016    Patient Active Problem List   Diagnosis Date Noted  . Gastritis 09/24/2014  . Severe obesity (BMI >= 40) (HCC) 09/24/2014  . Abdominal pain, epigastric 09/24/2014  . Constipation 09/24/2014  . PCOS (polycystic ovarian syndrome) 09/24/2014    History reviewed. No pertinent surgical history.  OB History    Obstetric Comments   Hx pregnancy no living children. Hx polycystic ovarian syndrome       Home Medications    Prior to Admission medications   Medication Sig Start Date End Date Taking? Authorizing Provider  amoxicillin (AMOXIL) 500 MG capsule Take 1 capsule (500 mg total) by mouth 3 (three) times daily. 09/05/17   Rolan Bucco, MD  HYDROcodone-acetaminophen (NORCO/VICODIN) 5-325 MG per tablet Take 1 tablet by mouth every 4 (four) hours as needed. 09/09/14   Ward, Layla Maw, DO  metFORMIN (GLUCOPHAGE) 500 MG tablet Take 2 tablets (1,000 mg total) by mouth daily with breakfast. 09/24/14   Funches, Gerilyn Nestle, MD  pantoprazole (PROTONIX) 40 MG tablet  Take 1 tablet (40 mg total) by mouth daily. 09/09/14   Ward, Layla Maw, DO  sucralfate (CARAFATE) 1 G tablet Take 1 tablet (1 g total) by mouth 4 (four) times daily -  with meals and at bedtime. 09/09/14   Ward, Layla Maw, DO  traMADol (ULTRAM) 50 MG tablet Take 1 tablet (50 mg total) by mouth every 6 (six) hours as needed. 09/05/17   Rolan Bucco, MD    Family History Family History  Problem Relation Age of Onset  . Diabetes Maternal Aunt     Social History Social History   Tobacco Use  . Smoking status: Current Every Day Smoker    Types: Cigarettes  . Smokeless tobacco: Never Used  Substance Use Topics  . Alcohol use: No  . Drug use: No     Allergies   Patient has no known allergies.   Review of Systems Review of Systems  Constitutional: Negative for fever.  HENT: Positive for dental problem.   Gastrointestinal: Negative for nausea and vomiting.  Musculoskeletal: Negative for neck pain.  Skin: Negative for wound.  Neurological: Positive for headaches.     Physical Exam Updated Vital Signs BP (!) 146/82 (BP Location: Left Arm)   Pulse 92   Temp 99 F (37.2 C) (Oral)   Resp 20   Ht 5\' 4"  (1.626 m)   Wt 104.3 kg (230 lb)   LMP 08/03/2017   SpO2 99%  BMI 39.48 kg/m   Physical Exam  Constitutional: She is oriented to person, place, and time. She appears well-developed and well-nourished.  HENT:  Head: Normocephalic and atraumatic.  Patient has tenderness over her left upper and lower back molars.  Both of these are decayed and cracked.  There is some mild swelling over the left upper molar.  There is no fluctuance or induration.  No trismus.  Uvula is midline.  Cardiovascular: Normal rate.  Pulmonary/Chest: Effort normal.  Neurological: She is alert and oriented to person, place, and time.  Skin: Skin is warm and dry.     ED Treatments / Results  Labs (all labs ordered are listed, but only abnormal results are displayed) Labs Reviewed - No data to  display  EKG  EKG Interpretation None       Radiology No results found.  Procedures Procedures (including critical care time)  Medications Ordered in ED Medications  ibuprofen (ADVIL,MOTRIN) tablet 400 mg (400 mg Oral Given 09/05/17 1838)     Initial Impression / Assessment and Plan / ED Course  I have reviewed the triage vital signs and the nursing notes.  Pertinent labs & imaging results that were available during my care of the patient were reviewed by me and considered in my medical decision making (see chart for details).     Patient is a 37 year old female who presents with toothache.  She has some mild facial swelling but no definitive abscess.  She was started on amoxicillin and given tramadol for pain.  She was given a list of low-cost dental resources and encouraged to try to follow-up with a dentist.  Return precautions were given.  Final Clinical Impressions(s) / ED Diagnoses   Final diagnoses:  Dental caries    ED Discharge Orders        Ordered    amoxicillin (AMOXIL) 500 MG capsule  3 times daily     09/05/17 2053    traMADol (ULTRAM) 50 MG tablet  Every 6 hours PRN     09/05/17 2053       Rolan BuccoBelfi, Rosemarie Galvis, MD 09/05/17 2057

## 2018-04-30 ENCOUNTER — Emergency Department (HOSPITAL_BASED_OUTPATIENT_CLINIC_OR_DEPARTMENT_OTHER)
Admission: EM | Admit: 2018-04-30 | Discharge: 2018-04-30 | Disposition: A | Discharge status: Home / Self Care | Payer: Self-pay | Attending: Emergency Medicine | Admitting: Emergency Medicine

## 2018-04-30 ENCOUNTER — Encounter (HOSPITAL_BASED_OUTPATIENT_CLINIC_OR_DEPARTMENT_OTHER): Payer: Self-pay

## 2018-04-30 ENCOUNTER — Other Ambulatory Visit: Payer: Self-pay

## 2018-04-30 DIAGNOSIS — F17210 Nicotine dependence, cigarettes, uncomplicated: Secondary | ICD-10-CM | POA: Insufficient documentation

## 2018-04-30 DIAGNOSIS — Z79899 Other long term (current) drug therapy: Secondary | ICD-10-CM | POA: Insufficient documentation

## 2018-04-30 DIAGNOSIS — K0889 Other specified disorders of teeth and supporting structures: Secondary | ICD-10-CM | POA: Insufficient documentation

## 2018-04-30 MED ORDER — CLINDAMYCIN HCL 150 MG PO CAPS: 300.0000 mg | ORAL_CAPSULE | Freq: Three times a day (TID) | ORAL | 0 refills | Status: AC

## 2018-04-30 NOTE — ED Triage Notes (Signed)
Pt presents with left sided dental pain. Hx of same. Does not have dentist.

## 2018-04-30 NOTE — ED Provider Notes (Signed)
[facility] EMERGENCY DEPARTMENT Provider Note   CSN: [id number] Arrival date & time: 04/30/18  2039     History   Chief Complaint Chief Complaint  Patient presents with  . Dental Pain    HPI Kathleen Bryan is a 37 y.o. female.  The history is provided by the patient.  Dental Pain   This is a new problem. The current episode started yesterday. The problem occurs daily. The problem has not changed since onset.The pain is at a severity of 2/10. The pain is mild. She has tried nothing for the symptoms. The treatment provided no relief.    Past Medical History:  Diagnosis Date  . GERD (gastroesophageal reflux disease) Dx 2016    Patient Active Problem List   Diagnosis Date Noted  . Gastritis 09/24/2014  . Severe obesity (BMI >= 40) (HCC) 09/24/2014  . Abdominal pain, epigastric 09/24/2014  . Constipation 09/24/2014  . PCOS (polycystic ovarian syndrome) 09/24/2014    History reviewed. No pertinent surgical history.   OB History   None    Obstetric Comments  Hx pregnancy no living children. Hx polycystic ovarian syndrome         Home Medications    Prior to Admission medications   Medication Sig Start Date End Date Taking? Authorizing Provider  amoxicillin (AMOXIL) 500 MG capsule Take 1 capsule (500 mg total) by mouth 3 (three) times daily. 09/05/17   Rolan Bucco, MD  clindamycin (CLEOCIN) 150 MG capsule Take 2 capsules (300 mg total) by mouth 3 (three) times daily for 7 days. 04/30/18 05/07/18  Arleene Settle, Madelaine Bhat, DO  HYDROcodone-acetaminophen (NORCO/VICODIN) 5-325 MG per tablet Take 1 tablet by mouth every 4 (four) hours as needed. 09/09/14   Ward, Layla Maw, DO  metFORMIN (GLUCOPHAGE) 500 MG tablet Take 2 tablets (1,000 mg total) by mouth daily with breakfast. 09/24/14   Funches, Gerilyn Nestle, MD  pantoprazole (PROTONIX) 40 MG tablet Take 1 tablet (40 mg total) by mouth daily. 09/09/14   Ward, Layla Maw, DO  sucralfate (CARAFATE) 1 G tablet Take 1 tablet (1 g  total) by mouth 4 (four) times daily -  with meals and at bedtime. 09/09/14   Ward, Layla Maw, DO  traMADol (ULTRAM) 50 MG tablet Take 1 tablet (50 mg total) by mouth every 6 (six) hours as needed. 09/05/17   Rolan Bucco, MD    Family History Family History  Problem Relation Age of Onset  . Diabetes Maternal Aunt     Social History Social History   Tobacco Use  . Smoking status: Current Every Day Smoker    Types: Cigarettes  . Smokeless tobacco: Never Used  Substance Use Topics  . Alcohol use: No  . Drug use: No     Allergies   Patient has no known allergies.   Review of Systems Review of Systems  Constitutional: Negative for chills and fever.  HENT: Positive for dental problem. Negative for ear pain and sore throat.   Eyes: Negative for pain and visual disturbance.  Respiratory: Negative for cough and shortness of breath.   Cardiovascular: Negative for chest pain and palpitations.  Gastrointestinal: Negative for abdominal pain and vomiting.  Genitourinary: Negative for dysuria and hematuria.  Musculoskeletal: Negative for arthralgias and back pain.  Skin: Negative for color change and rash.  Neurological: Negative for seizures and syncope.  All other systems reviewed and are negative.    Physical Exam Updated Vital Signs   ED Triage Vitals  Enc Vitals Group  BP 04/30/18 2058 132/79     Pulse Rate 04/30/18 2058 84     Resp 04/30/18 2058 16     Temp 04/30/18 2058 98.2 F (36.8 C)     Temp Source 04/30/18 2058 Oral     SpO2 04/30/18 2058 100 %     Weight --      Height --      Head Circumference --      Peak Flow --      Pain Score 04/30/18 2055 6     Pain Loc --      Pain Edu? --      Excl. in GC? --     Physical Exam  Constitutional: She appears well-developed and well-nourished. No distress.  HENT:  Head: Normocephalic and atraumatic.  Tender to palpation in left lower teeth area, no obvious abscess in that area  Eyes: Pupils are equal,  round, and reactive to light. Conjunctivae and EOM are normal.  Neck: Normal range of motion. Neck supple.  Cardiovascular: Normal rate, regular rhythm, normal heart sounds and intact distal pulses.  No murmur heard. Pulmonary/Chest: Effort normal and breath sounds normal. No respiratory distress.  Abdominal: Soft. There is no tenderness.  Musculoskeletal: She exhibits no edema.  Neurological: She is alert.  Skin: Skin is warm and dry.  Psychiatric: She has a normal mood and affect.  Nursing note and vitals reviewed.    ED Treatments / Results  Labs (all labs ordered are listed, but only abnormal results are displayed) Labs Reviewed - No data to display  EKG None  Radiology No results found.  Procedures Procedures (including critical care time)  Medications Ordered in ED Medications - No data to display   Initial Impression / Assessment and Plan / ED Course  I have reviewed the triage vital signs and the nursing notes.  Pertinent labs & imaging results that were available during my care of the patient were reviewed by me and considered in my medical decision making (see chart for details).     Kathleen Bryan is a 37 year old female who presents to the ED with dental pain.  Patient with normal vitals.  No fever.  Patient with lower left dental pain.  Has some tenderness in this area but no obvious abscess.  Overall teeth look healthy.  No concern for Ludwig or other acute process.  Given prescription for clindamycin and resources for dental follow-up.  Recommend Tylenol Motrin for pain.  Likely needs tooth pulled.  Understands return precautions.  This chart was dictated using voice recognition software.  Despite best efforts to proofread,  errors can occur which can change the documentation meaning.   Final Clinical Impressions(s) / ED Diagnoses   Final diagnoses:  Pain, dental    ED Discharge Orders         Ordered    clindamycin (CLEOCIN) 150 MG capsule  3 times  daily     04/30/18 2112           Virgina Norfolk, DO 04/30/18 2115

## 2018-05-17 IMAGING — CR DG CHEST 2V
2 series · 2 of 2 positions shown · non-contrast
Comparison: February 15, 2014

CLINICAL DATA: Chest pain

EXAM:
CHEST  2 VIEW

[w chest pa]
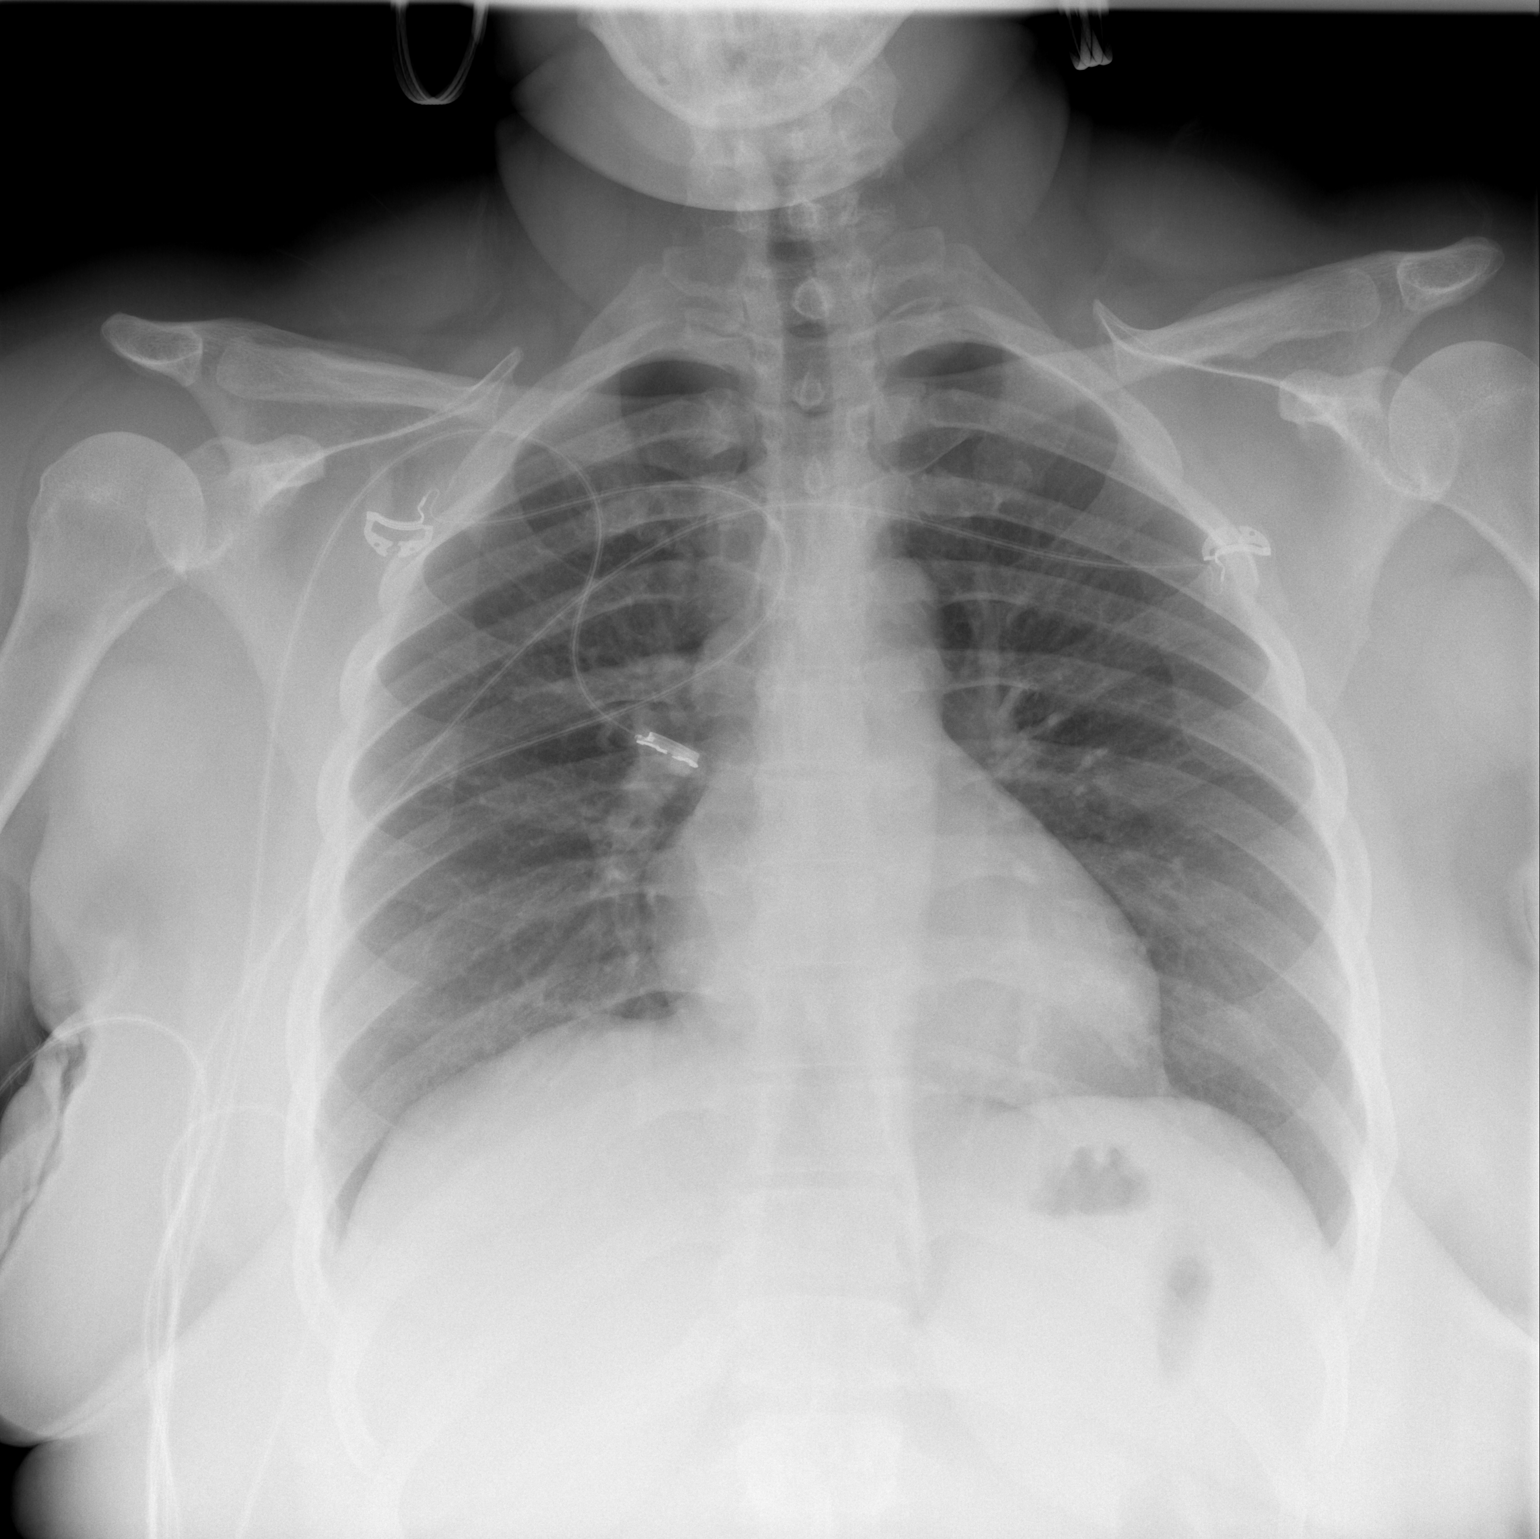

[w chest lat]
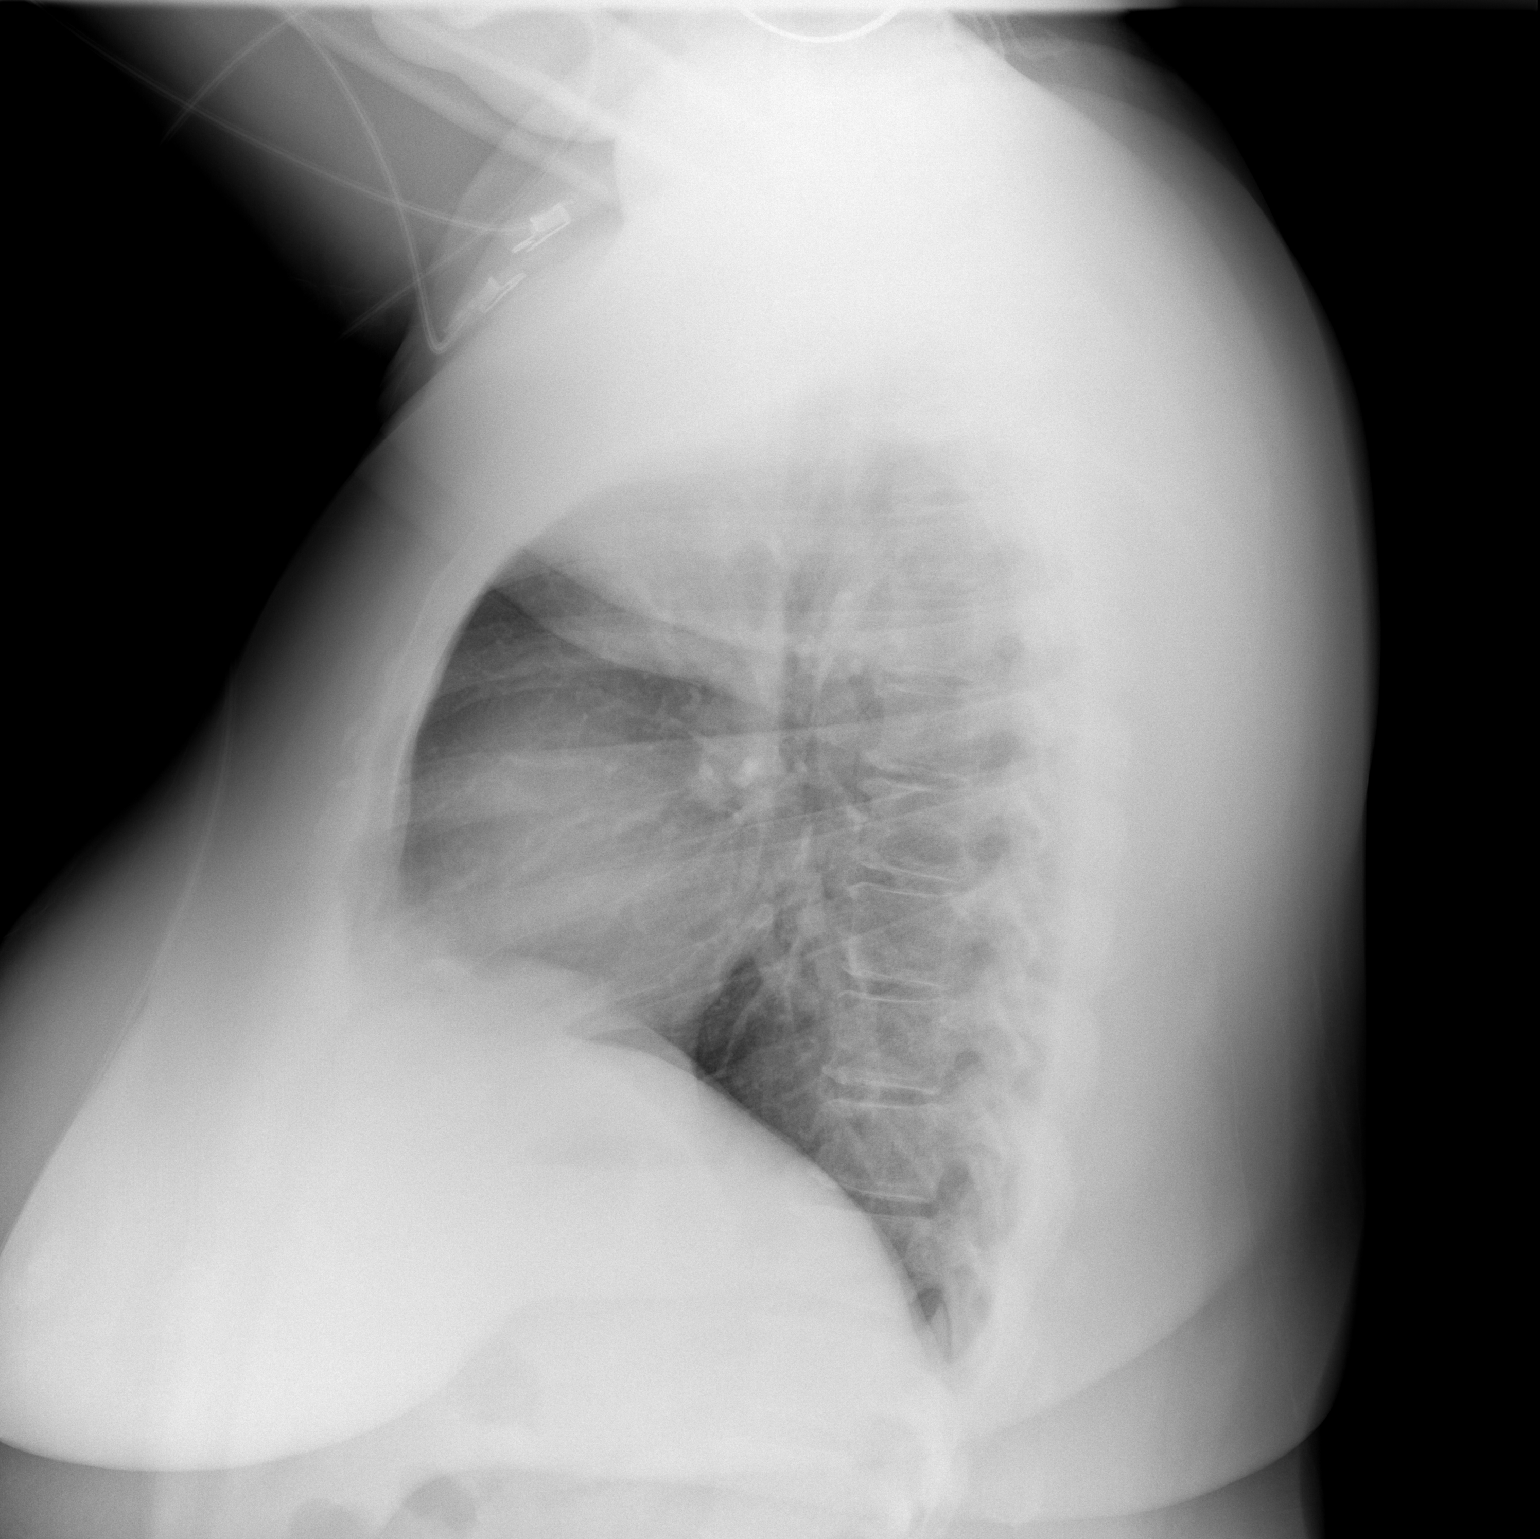

[2 of 2 positions shown; findings below may reference images not displayed]

FINDINGS: There is no edema or consolidation. The heart size and pulmonary
vascularity are normal. No adenopathy. No pneumothorax. No bone
lesions..
IMPRESSION: No edema or consolidation.

## 2018-05-26 ENCOUNTER — Encounter (HOSPITAL_BASED_OUTPATIENT_CLINIC_OR_DEPARTMENT_OTHER): Payer: Self-pay | Admitting: Emergency Medicine

## 2018-05-26 ENCOUNTER — Other Ambulatory Visit: Payer: Self-pay

## 2018-05-26 ENCOUNTER — Emergency Department (HOSPITAL_BASED_OUTPATIENT_CLINIC_OR_DEPARTMENT_OTHER)
Admission: EM | Admit: 2018-05-26 | Discharge: 2018-05-26 | Disposition: A | Discharge status: Home / Self Care | Payer: Self-pay | Attending: Emergency Medicine | Admitting: Emergency Medicine

## 2018-05-26 DIAGNOSIS — K0889 Other specified disorders of teeth and supporting structures: Secondary | ICD-10-CM | POA: Insufficient documentation

## 2018-05-26 DIAGNOSIS — Z79899 Other long term (current) drug therapy: Secondary | ICD-10-CM | POA: Insufficient documentation

## 2018-05-26 DIAGNOSIS — F17210 Nicotine dependence, cigarettes, uncomplicated: Secondary | ICD-10-CM | POA: Insufficient documentation

## 2018-05-26 DIAGNOSIS — K029 Dental caries, unspecified: Secondary | ICD-10-CM

## 2018-05-26 MED ORDER — ACETAMINOPHEN 325 MG PO TABS
650.0000 mg | ORAL_TABLET | Freq: Once | ORAL | Status: AC
Start: 2018-05-26 — End: 2018-05-26
  Administered 2018-05-26: 650 mg via ORAL
  Filled 2018-05-26: qty 2

## 2018-05-26 MED ORDER — TRAMADOL HCL 50 MG PO TABS: 50.0000 mg | ORAL_TABLET | Freq: Four times a day (QID) | ORAL | 0 refills | Status: DC | PRN

## 2018-05-26 MED ORDER — KETOROLAC TROMETHAMINE 30 MG/ML IJ SOLN
30.0000 mg | Freq: Once | INTRAMUSCULAR | Status: DC
  Filled 2018-05-26: qty 1

## 2018-05-26 MED ORDER — PENICILLIN V POTASSIUM 500 MG PO TABS: 500.0000 mg | ORAL_TABLET | Freq: Four times a day (QID) | ORAL | 0 refills | Status: AC

## 2018-05-26 NOTE — ED Provider Notes (Signed)
Complains of pain at tooth #17 intermittently for the past year.  She seen here recently prescribed clindamycin however stopped the clindamycin as it caused GI upset.  On exam she is in no distress.  Handling secretions well.  HEENT exam no facial asymmetry.  No trismus.  Tooth #17 appears decayed.  There is no surrounding fluctuance of gingiva.   Doug SouJacubowitz, Jasman Murri, MD 05/26/18 [id number]

## 2018-05-26 NOTE — Discharge Instructions (Addendum)
Please read instructions below. Take the antibiotic, Penicillin V, 4 times per day until they are gone. You can take extra strength tylenol as needed for pain. You can take tramadol as needed for severe pain. Schedule an appointment with a dentist, using the dental resource guide attached. Return to the ER for difficulty swallowing or breathing, fever, or new or worsening symptoms.

## 2018-05-26 NOTE — ED Provider Notes (Signed)
[facility] EMERGENCY DEPARTMENT Provider Note   CSN: [id number] Arrival date & time: 05/26/18  1345     History   Chief Complaint Chief Complaint  Patient presents with  . Dental Pain    HPI Kandis CockingFelicia Fisher is a 37 y.o. female w PMHx GERD, gastritis, presenting to the ED with complaint of persistent left lower dental pain.  Patient was evaluated on 04/30/2018 in this ED for similar symptoms.  She was prescribed prophylactic clindamycin.  She states she was unable to tolerate the medicine as it gave her significant exacerbation of her GERD.  Stopped taking the antibiotic after 4 days.  States symptoms slightly improved, however quickly returned.  She has been having frequent issues with this tooth and many other teeth for at least a year.  She was provided a Designer, jewellerydental resource guide, however states she has not made the effort to make an appointment.  She has been taking tramadol and Tylenol for pain.  Denies fevers or difficulty swallowing.  No purulent drainage in her mouth.  The history is provided by the patient and medical records.    Past Medical History:  Diagnosis Date  . GERD (gastroesophageal reflux disease) Dx 2016    Patient Active Problem List   Diagnosis Date Noted  . Gastritis 09/24/2014  . Severe obesity (BMI >= 40) (HCC) 09/24/2014  . Abdominal pain, epigastric 09/24/2014  . Constipation 09/24/2014  . PCOS (polycystic ovarian syndrome) 09/24/2014   History reviewed. No pertinent surgical history.   OB History   None    Obstetric Comments  Hx pregnancy no living children. Hx polycystic ovarian syndrome         Home Medications    Prior to Admission medications   Medication Sig Start Date End Date Taking? Authorizing Provider  amoxicillin (AMOXIL) 500 MG capsule Take 1 capsule (500 mg total) by mouth 3 (three) times daily. 09/05/17   Rolan BuccoBelfi, Melanie, MD  HYDROcodone-acetaminophen (NORCO/VICODIN) 5-325 MG per tablet Take 1 tablet by mouth every 4  (four) hours as needed. 09/09/14   Ward, Layla MawKristen N, DO  metFORMIN (GLUCOPHAGE) 500 MG tablet Take 2 tablets (1,000 mg total) by mouth daily with breakfast. 09/24/14   Funches, Gerilyn NestleJosalyn, MD  pantoprazole (PROTONIX) 40 MG tablet Take 1 tablet (40 mg total) by mouth daily. 09/09/14   Ward, Layla MawKristen N, DO  sucralfate (CARAFATE) 1 G tablet Take 1 tablet (1 g total) by mouth 4 (four) times daily -  with meals and at bedtime. 09/09/14   Ward, Layla MawKristen N, DO  traMADol (ULTRAM) 50 MG tablet Take 1 tablet (50 mg total) by mouth every 6 (six) hours as needed. 05/26/18   Kelwin Gibler, [place] N, PA-C    Family History Family History  Problem Relation Age of Onset  . Diabetes Maternal Aunt     Social History Social History   Tobacco Use  . Smoking status: Current Every Day Smoker    Types: Cigarettes  . Smokeless tobacco: Never Used  Substance Use Topics  . Alcohol use: No  . Drug use: No     Allergies   Patient has no known allergies.   Review of Systems Review of Systems  Constitutional: Negative for fever.  HENT: Positive for dental problem. Negative for sore throat and trouble swallowing.      Physical Exam Updated Vital Signs BP (!) 142/91 (BP Location: Left Arm)   Pulse 90   Temp 98 F (36.7 C) (Oral)   Resp 18   Ht 5\' 4"  (  1.626 m)   Wt 97.5 kg   LMP 05/12/2018   SpO2 100%   BMI 36.90 kg/m   Physical Exam  Constitutional: She appears well-developed and well-nourished. No distress.  HENT:  Head: Normocephalic and atraumatic.  Mouth/Throat: Uvula is midline. No trismus in the jaw. No uvula swelling.   left lower last molar with decay and tenderness.  Mild surrounding gingival erythema, no fluctuant gingival abscess.  There is no sublingual edema.  Tolerating secretions.  Eyes: Conjunctivae are normal.  Neck: Normal range of motion. Neck supple.  Cardiovascular: Normal rate.  Pulmonary/Chest: Effort normal.  Lymphadenopathy:    She has cervical adenopathy (Mild left-sided  submandibular lymphadenopathy.).  Psychiatric: She has a normal mood and affect. Her behavior is normal.  Nursing note and vitals reviewed.    ED Treatments / Results  Labs (all labs ordered are listed, but only abnormal results are displayed) Labs Reviewed - No data to display  EKG None  Radiology No results found.  Procedures Procedures (including critical care time)  Medications Ordered in ED Medications  acetaminophen (TYLENOL) tablet 650 mg (has no administration in time range)     Initial Impression / Assessment and Plan / ED Course  I have reviewed the triage vital signs and the nursing notes.  Pertinent labs & imaging results that were available during my care of the patient were reviewed by me and considered in my medical decision making (see chart for details).     Patient with dental caries.  No gross abscess.  VSS, afebrile, tolerating secretions. Exam unconcerning for peritonsillar abscess, Ludwig's angina or spread of infection.  Will treat with penicillin and pain medicine. Pt to discontinue clindamycin. Had discussion with patient that it is strongly recommended she follow-up with dentist, as the ED will not be able to fix her tooth. Pt safe for discharge.  [place] Controlled Substance reporting System queried  Discussed results, findings, treatment and follow up. Patient advised of return precautions. Patient verbalized understanding and agreed with plan.  Final Clinical Impressions(s) / ED Diagnoses   Final diagnoses:  Pain due to dental caries    ED Discharge Orders         Ordered    traMADol (ULTRAM) 50 MG tablet  Every 6 hours PRN     05/26/18 1456           Radie Berges, [place] N, PA-C 05/26/18 1457    Doug Sou, MD 05/26/18 1544

## 2018-05-26 NOTE — ED Notes (Signed)
Pt enrolled in aromatherapy pain trial 

## 2018-05-26 NOTE — ED Triage Notes (Signed)
Patient states that she was seen here for dental pain and was given and antibiotic. She stopped taking them when the pain went away because she had heart burn from them . Patient states that she tried to start taking them again when her tooth started to hurt and now they are upsetting her stomach

## 2021-12-16 ENCOUNTER — Other Ambulatory Visit: Payer: Self-pay

## 2021-12-16 ENCOUNTER — Encounter (HOSPITAL_BASED_OUTPATIENT_CLINIC_OR_DEPARTMENT_OTHER): Payer: Self-pay | Admitting: Pediatrics

## 2021-12-16 ENCOUNTER — Emergency Department (HOSPITAL_BASED_OUTPATIENT_CLINIC_OR_DEPARTMENT_OTHER)
Admission: EM | Admit: 2021-12-16 | Discharge: 2021-12-16 | Disposition: A | Discharge status: Home / Self Care | Payer: Self-pay | Attending: Emergency Medicine | Admitting: Emergency Medicine

## 2021-12-16 DIAGNOSIS — Z3491 Encounter for supervision of normal pregnancy, unspecified, first trimester: Secondary | ICD-10-CM

## 2021-12-16 DIAGNOSIS — N9489 Other specified conditions associated with female genital organs and menstrual cycle: Secondary | ICD-10-CM | POA: Insufficient documentation

## 2021-12-16 DIAGNOSIS — Z3201 Encounter for pregnancy test, result positive: Secondary | ICD-10-CM | POA: Insufficient documentation

## 2021-12-16 LAB — URINALYSIS, MICROSCOPIC (REFLEX)

## 2021-12-16 LAB — URINALYSIS, ROUTINE W REFLEX MICROSCOPIC
Bilirubin Urine: NEGATIVE
Glucose, UA: NEGATIVE mg/dL
Hgb urine dipstick: NEGATIVE
Ketones, ur: NEGATIVE mg/dL
Nitrite: NEGATIVE
Protein, ur: 30 mg/dL — AB
Specific Gravity, Urine: >=1.03 (ref 1.005–1.030)
pH: 6 (ref 5.0–8.0)

## 2021-12-16 LAB — HCG, QUANTITATIVE, PREGNANCY: hCG, Beta Chain, Quant, S: 25101 m[IU]/mL — ABNORMAL HIGH (ref <5)

## 2021-12-16 NOTE — ED Triage Notes (Signed)
Reported had a + home pregnancy; last known cycle was approx 2 months ago and has had some bleeding since then, denies bleeding at the moment and denies any urgency to push

## 2021-12-16 NOTE — ED Notes (Addendum)
Patient states she DID NOT take a pregnancy test at home, states he has been feeling fatigued as she did in her previous pregnancy.   G3P0 1 abortion, 1 miscarriage

## 2021-12-16 NOTE — Discharge Instructions (Signed)
Urine was normal today.  You should follow up with OB for further care.  Approx 6w pregnant

## 2022-03-03 ENCOUNTER — Other Ambulatory Visit: Payer: Self-pay

## 2022-03-03 ENCOUNTER — Encounter (HOSPITAL_BASED_OUTPATIENT_CLINIC_OR_DEPARTMENT_OTHER): Payer: Self-pay | Admitting: Emergency Medicine

## 2022-03-03 ENCOUNTER — Emergency Department (HOSPITAL_BASED_OUTPATIENT_CLINIC_OR_DEPARTMENT_OTHER): Payer: Commercial Managed Care - HMO

## 2022-03-03 ENCOUNTER — Emergency Department (HOSPITAL_BASED_OUTPATIENT_CLINIC_OR_DEPARTMENT_OTHER)
Admission: EM | Admit: 2022-03-03 | Discharge: 2022-03-03 | Disposition: A | Discharge status: Home / Self Care | Payer: Commercial Managed Care - HMO | Attending: Emergency Medicine | Admitting: Emergency Medicine

## 2022-03-03 DIAGNOSIS — O42912 Preterm premature rupture of membranes, unspecified as to length of time between rupture and onset of labor, second trimester: Secondary | ICD-10-CM | POA: Insufficient documentation

## 2022-03-03 DIAGNOSIS — Z20822 Contact with and (suspected) exposure to covid-19: Secondary | ICD-10-CM | POA: Insufficient documentation

## 2022-03-03 DIAGNOSIS — Z3A16 16 weeks gestation of pregnancy: Secondary | ICD-10-CM | POA: Diagnosis not present

## 2022-03-03 DIAGNOSIS — A599 Trichomoniasis, unspecified: Secondary | ICD-10-CM

## 2022-03-03 DIAGNOSIS — O23592 Infection of other part of genital tract in pregnancy, second trimester: Secondary | ICD-10-CM | POA: Insufficient documentation

## 2022-03-03 DIAGNOSIS — O209 Hemorrhage in early pregnancy, unspecified: Secondary | ICD-10-CM | POA: Diagnosis present

## 2022-03-03 LAB — WET PREP, GENITAL
Clue Cells Wet Prep HPF POC: NONE SEEN
Sperm: NONE SEEN
WBC, Wet Prep HPF POC: >=10 — AB (ref <10)
Yeast Wet Prep HPF POC: NONE SEEN

## 2022-03-03 LAB — COMPREHENSIVE METABOLIC PANEL
ALT: 11 U/L (ref 0–44)
AST: 15 U/L (ref 15–41)
Albumin: 3.1 g/dL — ABNORMAL LOW (ref 3.5–5.0)
Alkaline Phosphatase: 54 U/L (ref 38–126)
Anion gap: 8 (ref 5–15)
BUN: 7 mg/dL (ref 6–20)
CO2: 20 mmol/L — ABNORMAL LOW (ref 22–32)
Calcium: 8.6 mg/dL — ABNORMAL LOW (ref 8.9–10.3)
Chloride: 105 mmol/L (ref 98–111)
Creatinine, Ser: 0.72 mg/dL (ref 0.44–1.00)
GFR, Estimated: >60 mL/min (ref >60)
Glucose, Bld: 126 mg/dL — ABNORMAL HIGH (ref 70–99)
Potassium: 3.6 mmol/L (ref 3.5–5.1)
Sodium: 133 mmol/L — ABNORMAL LOW (ref 135–145)
Total Bilirubin: 0.4 mg/dL (ref 0.3–1.2)
Total Protein: 6.8 g/dL (ref 6.5–8.1)

## 2022-03-03 LAB — URINALYSIS, MICROSCOPIC (REFLEX)

## 2022-03-03 LAB — CBC WITH DIFFERENTIAL/PLATELET
Abs Immature Granulocytes: 0.02 10*3/uL (ref 0.00–0.07)
Basophils Absolute: 0 10*3/uL (ref 0.0–0.1)
Basophils Relative: 1 %
Eosinophils Absolute: 0.1 10*3/uL (ref 0.0–0.5)
Eosinophils Relative: 2 %
HCT: 35.1 % — ABNORMAL LOW (ref 36.0–46.0)
Hemoglobin: 12.5 g/dL (ref 12.0–15.0)
Immature Granulocytes: 0 %
Lymphocytes Relative: 20 %
Lymphs Abs: 1.6 10*3/uL (ref 0.7–4.0)
MCH: 31 pg (ref 26.0–34.0)
MCHC: 35.6 g/dL (ref 30.0–36.0)
MCV: 87.1 fL (ref 80.0–100.0)
Monocytes Absolute: 0.5 10*3/uL (ref 0.1–1.0)
Monocytes Relative: 6 %
Neutro Abs: 5.6 10*3/uL (ref 1.7–7.7)
Neutrophils Relative %: 71 %
Platelets: 232 10*3/uL (ref 150–400)
RBC: 4.03 MIL/uL (ref 3.87–5.11)
RDW: 12.8 % (ref 11.5–15.5)
WBC: 7.9 10*3/uL (ref 4.0–10.5)
nRBC: 0 % (ref 0.0–0.2)

## 2022-03-03 LAB — URINALYSIS, ROUTINE W REFLEX MICROSCOPIC
Bilirubin Urine: NEGATIVE
Glucose, UA: NEGATIVE mg/dL
Ketones, ur: NEGATIVE mg/dL
Nitrite: NEGATIVE
Protein, ur: 30 mg/dL — AB
Specific Gravity, Urine: 1.025 (ref 1.005–1.030)
pH: 6 (ref 5.0–8.0)

## 2022-03-03 LAB — RESP PANEL BY RT-PCR (FLU A&B, COVID) ARPGX2
Influenza A by PCR: NEGATIVE
Influenza B by PCR: NEGATIVE
SARS Coronavirus 2 by RT PCR: NEGATIVE

## 2022-03-03 LAB — ABO/RH: ABO/RH(D): AB POS

## 2022-03-03 LAB — HCG, QUANTITATIVE, PREGNANCY: hCG, Beta Chain, Quant, S: 6591 m[IU]/mL — ABNORMAL HIGH (ref <5)

## 2022-03-03 MED ORDER — METRONIDAZOLE 500 MG PO TABS
500.0000 mg | ORAL_TABLET | Freq: Two times a day (BID) | ORAL | 0 refills | Status: DC
Start: 2022-03-03 — End: 2022-10-21

## 2022-03-03 MED ORDER — METRONIDAZOLE 500 MG PO TABS
500.0000 mg | ORAL_TABLET | Freq: Once | ORAL | Status: AC
  Administered 2022-03-03: 500 mg via ORAL
  Filled 2022-03-03: qty 1

## 2022-03-03 MED ORDER — SODIUM CHLORIDE 0.9 % IV BOLUS
1000.0000 mL | Freq: Once | INTRAVENOUS | Status: AC
  Administered 2022-03-03: 1000 mL via INTRAVENOUS

## 2022-03-03 NOTE — ED Notes (Signed)
Pt d/c home per MD order. Discharge summary reviewed with pt, pt verbalizes understanding. Ambulatory off unit. No s/s of acute distress noted at discharge.  °

## 2022-03-03 NOTE — Discharge Instructions (Addendum)
You were seen in the emergency department today for vaginal bleeding and pelvic pain.  Your ultrasound showed that you had a loss of amniotic fluid otherwise known as a loss of membranes.  You will need to have a repeat evaluation by OB in 72 hours to discuss further planning.  Please call your OB tomorrow morning to schedule this.  If you are unable to get in with your OB please go to the MAU, additionally go to the MAU for any new or worsening symptoms including but not limited to new or worsening pain, increased bleeding, passing out, fever, or any other concerns.  Your pelvic sample did show that you have trichomonas which is a sexually transmitted infection, please take Flagyl for the next 1 week to treat this.  Please inform your sexual partners that they can be evaluated to.  Do not have sex of any kind until least 1 week following treatment.  You will need to be repeat tested in 3 months.  We have prescribed you new medication(s) today. Discuss the medications prescribed today with your pharmacist as they can have adverse effects and interactions with your other medicines including over the counter and prescribed medications. Seek medical evaluation if you start to experience new or abnormal symptoms after taking one of these medicines, seek care immediately if you start to experience difficulty breathing, feeling of your throat closing, facial swelling, or rash as these could be indications of a more serious allergic reaction

## 2022-03-03 NOTE — ED Notes (Signed)
EDP notified of pt temperature of 100.7

## 2022-03-03 NOTE — ED Provider Notes (Cosign Needed Addendum)
[facility] EMERGENCY DEPARTMENT Provider Note   CSN: [id number] Arrival date & time: 03/03/22  1408     History  Chief Complaint  Patient presents with   Vaginal Bleeding    Kathleen Bryan is a 41 y.o. female with a hx of PCOS and tobacco use who estimates to be 3-4 months pregnant presents to the ED with complaints of vaginal bleeding that began last night. Patient reports she has a gush of blood from her vagina last night with some clot passage, however has since had mild spotting/discharge. Associated left pelvic pain. No alleviating/aggravating factors. Feels lightheaded at times. Has not seen OB yet- scheduled for later this month. Had some similar bleeding last month. Denies fever, vomiting, syncope, diarrhea, or dysuria.   HPI     Home Medications Prior to Admission medications   Medication Sig Start Date End Date Taking? Authorizing Provider  amoxicillin (AMOXIL) 500 MG capsule Take 1 capsule (500 mg total) by mouth 3 (three) times daily. 09/05/17   Rolan Bucco, MD  HYDROcodone-acetaminophen (NORCO/VICODIN) 5-325 MG per tablet Take 1 tablet by mouth every 4 (four) hours as needed. 09/09/14   Ward, Layla Maw, DO  metFORMIN (GLUCOPHAGE) 500 MG tablet Take 2 tablets (1,000 mg total) by mouth daily with breakfast. 09/24/14   Funches, Gerilyn Nestle, MD  pantoprazole (PROTONIX) 40 MG tablet Take 1 tablet (40 mg total) by mouth daily. 09/09/14   Ward, Layla Maw, DO  sucralfate (CARAFATE) 1 G tablet Take 1 tablet (1 g total) by mouth 4 (four) times daily -  with meals and at bedtime. 09/09/14   Ward, Layla Maw, DO  traMADol (ULTRAM) 50 MG tablet Take 1 tablet (50 mg total) by mouth every 6 (six) hours as needed. 05/26/18   Robinson, [place] N, PA-C      Allergies    Patient has no known allergies.    Review of Systems   Review of Systems  Constitutional:  Negative for fever.  Respiratory:  Negative for shortness of breath.   Cardiovascular:  Negative for chest pain.   Gastrointestinal:  Negative for blood in stool, diarrhea and vomiting.  Genitourinary:  Positive for pelvic pain, vaginal bleeding and vaginal discharge. Negative for dysuria.  Neurological:  Positive for light-headedness. Negative for syncope.  All other systems reviewed and are negative.   Physical Exam Updated Vital Signs BP 125/63   Pulse 87   Temp 99.2 F (37.3 C) (Oral)   Resp 18   Ht 5\' 4"  (1.626 m)   Wt 104.3 kg   LMP 10/09/2021 (Approximate)   SpO2 100%   BMI 39.48 kg/m  Physical Exam Vitals and nursing note reviewed. Exam conducted with a chaperone present.  Constitutional:      General: She is not in acute distress.    Appearance: She is well-developed. She is not toxic-appearing.  HENT:     Head: Normocephalic and atraumatic.  Eyes:     General:        Right eye: No discharge.        Left eye: No discharge.     Conjunctiva/sclera: Conjunctivae normal.  Cardiovascular:     Rate and Rhythm: Normal rate and regular rhythm.  Pulmonary:     Effort: No respiratory distress.     Breath sounds: Normal breath sounds. No wheezing or rales.  Abdominal:     General: There is no distension.     Palpations: Abdomen is soft.     Tenderness: There is abdominal tenderness (left suprapbuic).  There is no guarding or rebound.  Genitourinary:    Labia:        Right: No lesion.        Left: No lesion.      Comments: Patient has some thick yellow vaginal discharge at her cervix, also has a moderate amount of thin yellow to clear appearing discharge that is pulled in the vaginal canal.  No active bleeding.  Musculoskeletal:     Cervical back: Neck supple.  Skin:    General: Skin is warm and dry.  Neurological:     Mental Status: She is alert.     Comments: Clear speech.   Psychiatric:        Behavior: Behavior normal.     ED Results / Procedures / Treatments   Labs (all labs ordered are listed, but only abnormal results are displayed) Labs Reviewed  WET PREP,  GENITAL - Abnormal; Notable for the following components:      Result Value   Trich, Wet Prep PRESENT (*)    WBC, Wet Prep HPF POC >=10 (*)    All other components within normal limits  HCG, QUANTITATIVE, PREGNANCY - Abnormal; Notable for the following components:   hCG, Beta Chain, Quant, S 6,591 (*)    All other components within normal limits  CBC WITH DIFFERENTIAL/PLATELET - Abnormal; Notable for the following components:   HCT 35.1 (*)    All other components within normal limits  COMPREHENSIVE METABOLIC PANEL - Abnormal; Notable for the following components:   Sodium 133 (*)    CO2 20 (*)    Glucose, Bld 126 (*)    Calcium 8.6 (*)    Albumin 3.1 (*)    All other components within normal limits  URINALYSIS, ROUTINE W REFLEX MICROSCOPIC - Abnormal; Notable for the following components:   Color, Urine AMBER (*)    APPearance HAZY (*)    Hgb urine dipstick TRACE (*)    Protein, ur 30 (*)    Leukocytes,Ua TRACE (*)    All other components within normal limits  URINALYSIS, MICROSCOPIC (REFLEX) - Abnormal; Notable for the following components:   Bacteria, UA RARE (*)    All other components within normal limits  ABO/RH  GC/CHLAMYDIA PROBE AMP ([place]) NOT AT [facility]    EKG None  Radiology US OB Limited  Result Date: 03/03/2022 CLINICAL DATA:  Vaginal bleeding. EXAM: LIMITED OBSTETRIC ULTRASOUND COMPARISON:  None Available. FINDINGS: Number of Fetuses: 1 Heart Rate:  197 bpm Movement: No Presentation: Cephalic Placental Location: Anterior Previa: No Amniotic Fluid (Subjective):  No significant amniotic fluid. AFI: 0 cm BPD: 3.4 cm 16 w  4 d MATERNAL FINDINGS: Cervix:  Closed Uterus/Adnexae: No abnormality visualized. IMPRESSION: 1. Anhydramnios. 2. Single intrauterine fetus in cephalic position. 3. Fetal tachycardia.  No fetal movement detected. 4. Limited biometry correlates with gestational age of [redacted] weeks 4 days. 5. Cervix is closed. This exam is performed on an emergent basis  and does not comprehensively evaluate fetal size, dating, or anatomy; follow-up complete OB US should be considered if further fetal assessment is warranted. Electronically Signed   By: Nolon Nations M.D.   On: 03/03/2022 18:16    Procedures Procedures    Medications Ordered in ED Medications  sodium chloride 0.9 % bolus 1,000 mL (1,000 mLs Intravenous New Bag/Given 03/03/22 1715)    ED Course/ Medical Decision Making/ A&P  Medical Decision Making Amount and/or Complexity of Data Reviewed Labs: ordered. Radiology: ordered.  Risk Prescription drug management.  Patient presents to the ED with complaints of vaginal bleeding & pelvic pain in the setting of pregnancy, this involves an extensive number of treatment options, and is a complaint that carries with it a high risk of complications and morbidity. Nontoxic, vitals w/ mild elevation in BP on arrival..  Left suprapubic tenderness to palpation.  No peritoneal signs.  Speculum exam performed with swabs taken from the distal vagina, bimanual exam deferred.  Additional history obtained:  Chart/nursing notes reviewed.  External records viewed including: most recent ED visit.   Lab Tests:  I viewed & interpreted labs including:  CBC: Unremarkable CMP: Mild abnormalities as above, no critical electrolyte derangement Urinalysis: Rare bacteria, trace leukocytes, sent for culture in the setting of pregnancy Prep: Findings of trichomonas ABO/Rh: AB+  Imaging Studies:  I ordered and viewed the following imaging, agree with radiologist impression:  [place]: 1. Anhydramnios. 2. Single intrauterine fetus in cephalic position. 3. Fetal tachycardia.  No fetal movement detected. 4. Limited biometry correlates with gestational age of [redacted] weeks 4 days. 5. Cervix is closed. This exam is performed on an emergent basis and does not comprehensively evaluate fetal size, dating, or anatomy; follow-up complete OB US should be  considered if further fetal assessment is warranted.    ED Course:  I ordered medications including 1 L normal saline for patient's lightheadedness.  Given findings on ultrasound will discuss with obstetrics.  18:20: CONSULT: Discussed patient presentation and findings with OBGYN Dr. Despina Hidden who has reviewed her ultrasound, relays this is likely an inevitable abortion with ruptured membranes at this point in pregnancy, given fetus age with current heart beat present unable to intervene at this time, will need repeat OB evaluation in 72 hours- recommends to call her OB to get this scheduled and if not able to go to the MAU. Recommends tx of trich w/ flagyl, relays cannot have PID while pregnant. Appreciate consultation.   I discussed results, treatment plan, need for follow-up, and return precautions with the patient. Provided opportunity for questions, patient confirmed understanding and is in agreement with plan.   Discussed w/ attending Dr. Adela Lank- in agreement.   Portions of this note were generated with Scientist, clinical (histocompatibility and immunogenetics). Dictation errors may occur despite best attempts at proofreading.   Final Clinical Impression(s) / ED Diagnoses Final diagnoses:  Premature rupture of membranes in second trimester, unspecified duration to onset of labor  Trichomoniasis    Rx / DC Orders ED Discharge Orders          Ordered    metroNIDAZOLE (FLAGYL) 500 MG tablet  2 times daily        03/03/22 1853              Augustine Leverette, Pleas Koch, PA-C 03/03/22 1858  Patient with fever at time of discharge 100.7.  Vitals otherwise within normal limits.  I called and re- discussed with Dr. Despina Hidden, no change in plan from an OB standpoint, will COVID test.  Patient remains appearing appropriate for discharge at this time.    Cherly Anderson, PA-C 03/05/22 0027    Melene Plan, DO 03/07/22 [phone]

## 2022-03-03 NOTE — ED Triage Notes (Signed)
[redacted] weeks pregnant , vaginal bleeding last night , passing one clot . Left groin pain . Lightheaded.

## 2022-03-05 LAB — URINE CULTURE: Culture: 20000 — AB

## 2022-03-06 ENCOUNTER — Telehealth: Payer: Self-pay | Admitting: Emergency Medicine

## 2022-03-06 NOTE — Progress Notes (Signed)
ED Antimicrobial Stewardship Positive Culture Follow Up   Kathleen Bryan is an 41 y.o. female who presented to [facility] on 03/03/2022 with a chief complaint of  Chief Complaint  Patient presents with   Vaginal Bleeding    Recent Results (from the past 720 hour(s))  Urine Culture     Status: Abnormal   Collection Time: 03/03/22  2:48 PM   Specimen: Urine, Clean Catch  Result Value Ref Range Status   Specimen Description   Final    URINE, CLEAN CATCH Performed at [facility], [address] [place], [place] [address]    Special Requests   Final    NONE Performed at [facility], [address] [place], [place] [address]    Culture (A)  Final    20,000 COLONIES/mL GROUP B STREP(S.AGALACTIAE)ISOLATED TESTING AGAINST S. AGALACTIAE NOT ROUTINELY PERFORMED DUE TO PREDICTABILITY OF AMP/PEN/VAN SUSCEPTIBILITY. Performed at [facility], [address] [place], [place] [address]    Report Status 03/05/2022 FINAL  Final  Wet prep, genital     Status: Abnormal   Collection Time: 03/03/22  4:28 PM  Result Value Ref Range Status   Yeast Wet Prep HPF POC NONE SEEN NONE SEEN Final   Trich, Wet Prep PRESENT (A) NONE SEEN Final   Clue Cells Wet Prep HPF POC NONE SEEN NONE SEEN Final   WBC, Wet Prep HPF POC >=10 (A) <10 Final   Sperm NONE SEEN  Final    Comment: Performed at [facility] [address] [place], [place] [address]  Resp Panel by RT-PCR (Flu A&B, Covid) Anterior Nasal Swab     Status: None   Collection Time: 03/03/22  7:13 PM   Specimen: Anterior Nasal Swab  Result Value Ref Range Status   SARS Coronavirus 2 by RT PCR NEGATIVE NEGATIVE Final    Comment: (NOTE) SARS-CoV-2 target nucleic acids are NOT DETECTED.  The SARS-CoV-2 RNA is generally detectable in upper respiratory specimens during the acute phase of infection. The lowest concentration of SARS-CoV-2 viral copies this assay can detect is 138 copies/mL. A negative result  does not preclude SARS-Cov-2 infection and should not be used as the sole basis for treatment or other patient management decisions. A negative result may occur with  improper specimen collection/handling, submission of specimen other than nasopharyngeal swab, presence of viral mutation(s) within the areas targeted by this assay, and inadequate number of viral copies(<138 copies/mL). A negative result must be combined with clinical observations, patient history, and epidemiological information. The expected result is Negative.  Fact Sheet for Patients:  BloggerCourse.com  Fact Sheet for Healthcare Providers:  SeriousBroker.it  This test is no t yet approved or cleared by the [place] FDA and  has been authorized for detection and/or diagnosis of SARS-CoV-2 by FDA under an Emergency Use Authorization (EUA). This EUA will remain  in effect (meaning this test can be used) for the duration of the COVID-19 declaration under Section 564(b)(1) of the Act, 21 U.S.C.section 360bbb-3(b)(1), unless the authorization is terminated  or revoked sooner.       Influenza A by PCR NEGATIVE NEGATIVE Final   Influenza B by PCR NEGATIVE NEGATIVE Final    Comment: (NOTE) The Xpert Xpress SARS-CoV-2/FLU/RSV plus assay is intended as an aid in the diagnosis of influenza from Nasopharyngeal swab specimens and should not be used as a sole basis for treatment. Nasal washings and aspirates are unacceptable for Xpert Xpress  SARS-CoV-2/FLU/RSV testing.  Fact Sheet for Patients: BloggerCourse.com  Fact Sheet for Healthcare Providers: SeriousBroker.it  This test is not yet approved or cleared by the [place] FDA and has been authorized for detection and/or diagnosis of SARS-CoV-2 by FDA under an Emergency Use Authorization (EUA). This EUA will remain in effect (meaning this test can be used) for  the duration of the COVID-19 declaration under Section 564(b)(1) of the Act, 21 U.S.C. section 360bbb-3(b)(1), unless the authorization is terminated or revoked.  Performed at [facility], [address], [place], [place] [address]     Have patient f/u with OB regarding culture.  ED Provider: [place]   Delmar Landau, PharmD, BCPS 03/06/2022 11:11 AM ED Clinical Pharmacist -  [phone]

## 2022-03-06 NOTE — Telephone Encounter (Signed)
Post ED Visit - Positive Culture Follow-up: Successful Patient Follow-Up  Culture assessed and recommendations reviewed by:  []  , Pharm.D. []  Enzo Bi, Pharm.D., BCPS AQ-ID []  , Pharm.D., BCPS []  Celedonio Miyamoto, Pharm.D., BCPS []  [employer], Garvin Fila.D., BCPS, AAHIVP []  , Pharm.D., BCPS, AAHIVP []  Georgina Pillion, PharmD, BCPS []  , PharmD, BCPS []  [place], PharmD, BCPS [x]  [address], PharmD  Positive urine culture  []  Patient discharged without antimicrobial prescription and treatment is now indicated []  Organism is resistant to prescribed ED discharge antimicrobial []  Patient with positive blood cultures  Plan: Call patient to follow-up with OB Contacted patient, date 03/06/22, time 1900 Patient notified. Patient states she will call OB tomorrow.   Estella Husk Kayleeann Huxford 03/06/2022, 8:12 PM

## 2022-03-08 LAB — GC/CHLAMYDIA PROBE AMP (~~LOC~~) NOT AT ARMC
Chlamydia: NEGATIVE
Comment: NEGATIVE
Comment: NORMAL
Neisseria Gonorrhea: NEGATIVE

## 2022-10-15 ENCOUNTER — Encounter (HOSPITAL_BASED_OUTPATIENT_CLINIC_OR_DEPARTMENT_OTHER): Payer: Self-pay | Admitting: Emergency Medicine

## 2022-10-15 ENCOUNTER — Emergency Department (HOSPITAL_BASED_OUTPATIENT_CLINIC_OR_DEPARTMENT_OTHER)
Admission: EM | Admit: 2022-10-15 | Discharge: 2022-10-15 | Disposition: A | Discharge status: Home / Self Care | Payer: Medicaid Other | Attending: Emergency Medicine | Admitting: Emergency Medicine

## 2022-10-15 ENCOUNTER — Other Ambulatory Visit: Payer: Self-pay

## 2022-10-15 DIAGNOSIS — R1084 Generalized abdominal pain: Secondary | ICD-10-CM | POA: Diagnosis not present

## 2022-10-15 DIAGNOSIS — R109 Unspecified abdominal pain: Secondary | ICD-10-CM | POA: Diagnosis present

## 2022-10-15 LAB — COMPREHENSIVE METABOLIC PANEL
ALT: 14 U/L (ref 0–44)
AST: 19 U/L (ref 15–41)
Albumin: 3.6 g/dL (ref 3.5–5.0)
Alkaline Phosphatase: 58 U/L (ref 38–126)
Anion gap: 8 (ref 5–15)
BUN: 7 mg/dL (ref 6–20)
CO2: 23 mmol/L (ref 22–32)
Calcium: 8.4 mg/dL — ABNORMAL LOW (ref 8.9–10.3)
Chloride: 104 mmol/L (ref 98–111)
Creatinine, Ser: 0.97 mg/dL (ref 0.44–1.00)
GFR, Estimated: >60 mL/min (ref >60)
Glucose, Bld: 87 mg/dL (ref 70–99)
Potassium: 4.3 mmol/L (ref 3.5–5.1)
Sodium: 135 mmol/L (ref 135–145)
Total Bilirubin: 0.5 mg/dL (ref 0.3–1.2)
Total Protein: 7 g/dL (ref 6.5–8.1)

## 2022-10-15 LAB — PREGNANCY, URINE: Preg Test, Ur: NEGATIVE

## 2022-10-15 LAB — CBC
HCT: 41.1 % (ref 36.0–46.0)
Hemoglobin: 14.1 g/dL (ref 12.0–15.0)
MCH: 30.7 pg (ref 26.0–34.0)
MCHC: 34.3 g/dL (ref 30.0–36.0)
MCV: 89.3 fL (ref 80.0–100.0)
Platelets: 248 10*3/uL (ref 150–400)
RBC: 4.6 MIL/uL (ref 3.87–5.11)
RDW: 12.9 % (ref 11.5–15.5)
WBC: 6.6 10*3/uL (ref 4.0–10.5)
nRBC: 0 % (ref 0.0–0.2)

## 2022-10-15 LAB — URINALYSIS, ROUTINE W REFLEX MICROSCOPIC
Bilirubin Urine: NEGATIVE
Glucose, UA: NEGATIVE mg/dL
Hgb urine dipstick: NEGATIVE
Ketones, ur: NEGATIVE mg/dL
Leukocytes,Ua: NEGATIVE
Nitrite: NEGATIVE
Protein, ur: NEGATIVE mg/dL
Specific Gravity, Urine: >=1.03 (ref 1.005–1.030)
pH: 6 (ref 5.0–8.0)

## 2022-10-15 LAB — LIPASE, BLOOD: Lipase: 52 U/L — ABNORMAL HIGH (ref 11–51)

## 2022-10-15 MED ORDER — ALUM & MAG HYDROXIDE-SIMETH 200-200-20 MG/5ML PO SUSP
30.0000 mL | Freq: Once | ORAL | Status: AC
  Administered 2022-10-15: 30 mL via ORAL
  Filled 2022-10-15: qty 30

## 2022-10-15 MED ORDER — DICYCLOMINE HCL 10 MG PO CAPS
10.0000 mg | ORAL_CAPSULE | Freq: Once | ORAL | Status: AC
  Administered 2022-10-15: 10 mg via ORAL
  Filled 2022-10-15: qty 1

## 2022-10-15 NOTE — ED Provider Notes (Signed)
[place] [facility] AT [facility] Provider Note   CSN: [id number] Arrival date & time: 10/15/22  1511     History  Chief Complaint  Patient presents with   Abdominal Pain    Kathleen Bryan is a 42 y.o. female.  42 yo  F With a chief complaint of abdominal pain.  This been an ongoing issue for her.  She has been seen in the ED for this previously.  And says that for years this been ongoing.  Feels like she is constipated and then sometimes feels like she is bloated.  Has had some episodes where she feels like she needs to vomit.  Usually is started by her inducing vomiting digitally.  She feels like its gotten worse over time.  Took 2 ibuprofens at work without much improvement.  Came here for evaluation.   Abdominal Pain      Home Medications Prior to Admission medications   Medication Sig Start Date End Date Taking? Authorizing Provider  amoxicillin (AMOXIL) 500 MG capsule Take 1 capsule (500 mg total) by mouth 3 (three) times daily. 09/05/17   Rolan Bucco, MD  HYDROcodone-acetaminophen (NORCO/VICODIN) 5-325 MG per tablet Take 1 tablet by mouth every 4 (four) hours as needed. 09/09/14   Ward, Layla Maw, DO  metFORMIN (GLUCOPHAGE) 500 MG tablet Take 2 tablets (1,000 mg total) by mouth daily with breakfast. 09/24/14   Dessa Phi, MD  metroNIDAZOLE (FLAGYL) 500 MG tablet Take 1 tablet (500 mg total) by mouth 2 (two) times daily. 03/03/22   Petrucelli, Samantha R, PA-C  pantoprazole (PROTONIX) 40 MG tablet Take 1 tablet (40 mg total) by mouth daily. 09/09/14   Ward, Layla Maw, DO  sucralfate (CARAFATE) 1 G tablet Take 1 tablet (1 g total) by mouth 4 (four) times daily -  with meals and at bedtime. 09/09/14   Ward, Layla Maw, DO  traMADol (ULTRAM) 50 MG tablet Take 1 tablet (50 mg total) by mouth every 6 (six) hours as needed. 05/26/18   Robinson, [place] N, PA-C      Allergies    Patient has no known allergies.    Review of Systems   Review of Systems   Gastrointestinal:  Positive for abdominal pain.    Physical Exam Updated Vital Signs BP (!) 179/89 (BP Location: Right Arm)   Pulse 64   Temp 98.7 F (37.1 C) (Oral)   Resp 18   Ht 5\' 4"  (1.626 m)   Wt 104.3 kg   LMP 09/13/2021 (Approximate)   SpO2 100%   Breastfeeding Unknown   BMI 39.48 kg/m  Physical Exam Vitals and nursing note reviewed.  Constitutional:      General: She is not in acute distress.    Appearance: She is well-developed. She is not diaphoretic.  HENT:     Head: Normocephalic and atraumatic.  Eyes:     Pupils: Pupils are equal, round, and reactive to light.  Cardiovascular:     Rate and Rhythm: Normal rate and regular rhythm.     Heart sounds: No murmur heard.    No friction rub. No gallop.  Pulmonary:     Effort: Pulmonary effort is normal.     Breath sounds: No wheezing or rales.  Abdominal:     General: There is no distension.     Palpations: Abdomen is soft.     Tenderness: There is no abdominal tenderness.     Comments: Mild diffuse abdominal pain, worse upper than lower  Musculoskeletal:  General: No tenderness.     Cervical back: Normal range of motion and neck supple.  Skin:    General: Skin is warm and dry.  Neurological:     Mental Status: She is alert and oriented to person, place, and time.  Psychiatric:        Behavior: Behavior normal.     ED Results / Procedures / Treatments   Labs (all labs ordered are listed, but only abnormal results are displayed) Labs Reviewed  LIPASE, BLOOD - Abnormal; Notable for the following components:      Result Value   Lipase 52 (*)    All other components within normal limits  COMPREHENSIVE METABOLIC PANEL - Abnormal; Notable for the following components:   Calcium 8.4 (*)    All other components within normal limits  CBC  URINALYSIS, ROUTINE W REFLEX MICROSCOPIC  PREGNANCY, URINE    EKG None  Radiology No results found.  Procedures Procedures   Discussed smoking cessation  with patient and was they were offerred resources to help stop.  Total time was 5 min CPT code 16109.     Medications Ordered in ED Medications  alum & mag hydroxide-simeth (MAALOX/MYLANTA) 200-200-20 MG/5ML suspension 30 mL (30 mLs Oral Given 10/15/22 1638)  dicyclomine (BENTYL) capsule 10 mg (10 mg Oral Given 10/15/22 1638)    ED Course/ Medical Decision Making/ A&P                             Medical Decision Making Amount and/or Complexity of Data Reviewed Labs: ordered.  Risk OTC drugs. Prescription drug management.   42 yo F with a chief complaints of abdominal pain.  Is been an ongoing issue for her.  Has been seen in the ED previously, and she tells me this is the same.  On my record review this goes back as far as 8 years ago.  I do not feel that imaging would likely be of benefit.  Abdominal exam without obvious focal tenderness.  Will obtain a laboratory evaluation.  GI cocktail.  Likely GI referral.  Patient has a trivially elevated lipase of 52 otherwise no obvious elevation to the LFTs.  No acute anemia, UA negative for infection.  Pregnancy negative.  Patient feeling a bit better on repeat assessment.  Will treat as reflux.  Encouraged her to follow-up with GI as this been going on for many years.  6:04 PM:  I have discussed the diagnosis/risks/treatment options with the patient.  Evaluation and diagnostic testing in the emergency department does not suggest an emergent condition requiring admission or immediate intervention beyond what has been performed at this time.  They will follow up with PCP, GI. We also discussed returning to the ED immediately if new or worsening sx occur. We discussed the sx which are most concerning (e.g., sudden worsening pain, fever, inability to tolerate by mouth) that necessitate immediate return. Medications administered to the patient during their visit and any new prescriptions provided to the patient are listed below.  Medications given  during this visit Medications  alum & mag hydroxide-simeth (MAALOX/MYLANTA) 200-200-20 MG/5ML suspension 30 mL (30 mLs Oral Given 10/15/22 1638)  dicyclomine (BENTYL) capsule 10 mg (10 mg Oral Given 10/15/22 1638)     The patient appears reasonably screen and/or stabilized for discharge and I doubt any other medical condition or other [facility] requiring further screening, evaluation, or treatment in the ED at this time prior to discharge.  Final Clinical Impression(s) / ED Diagnoses Final diagnoses:  Generalized abdominal pain    Rx / DC Orders ED Discharge Orders     None         Melene Plan, DO 10/15/22 1804

## 2022-10-15 NOTE — ED Triage Notes (Signed)
Pt c/o upper abd pain radiating around to RT flank area; sts she often has this problem, as well as constipation

## 2022-10-15 NOTE — Discharge Instructions (Signed)
There is no obvious concerning lab value here.  Please call the gastroenterology office on Monday to try and set up an appointment.  It is hard to quit smoking.  There is a hotline number in this paperwork for someone can try and help you if you need it. Try pepcid or tagamet up to twice a day.  Try to avoid things that may make this worse, most commonly these are spicy foods tomato based products fatty foods chocolate and peppermint.  Alcohol and tobacco can also make this worse.  Return to the emergency department for sudden worsening pain fever or inability to eat or drink.  Take 8 scoops of miralax in 32oz of whatever you would like to drink.(Gatorade comes in this size) You can also use a fleets enema which you can buy over the counter at the pharmacy.  Return for worsening abdominal pain, vomiting or fever.

## 2022-10-19 ENCOUNTER — Encounter (HOSPITAL_BASED_OUTPATIENT_CLINIC_OR_DEPARTMENT_OTHER): Payer: Self-pay | Admitting: Emergency Medicine

## 2022-10-19 ENCOUNTER — Observation Stay (HOSPITAL_BASED_OUTPATIENT_CLINIC_OR_DEPARTMENT_OTHER)
Admission: EM | Admit: 2022-10-19 | Discharge: 2022-10-21 | Disposition: A | Discharge status: Home / Self Care | Payer: Medicaid Other | Attending: General Surgery | Admitting: General Surgery

## 2022-10-19 ENCOUNTER — Other Ambulatory Visit: Payer: Self-pay

## 2022-10-19 ENCOUNTER — Emergency Department (HOSPITAL_BASED_OUTPATIENT_CLINIC_OR_DEPARTMENT_OTHER): Payer: Medicaid Other

## 2022-10-19 DIAGNOSIS — K8010 Calculus of gallbladder with chronic cholecystitis without obstruction: Secondary | ICD-10-CM | POA: Diagnosis not present

## 2022-10-19 DIAGNOSIS — F17210 Nicotine dependence, cigarettes, uncomplicated: Secondary | ICD-10-CM | POA: Diagnosis not present

## 2022-10-19 DIAGNOSIS — R1013 Epigastric pain: Secondary | ICD-10-CM | POA: Diagnosis present

## 2022-10-19 DIAGNOSIS — Z79899 Other long term (current) drug therapy: Secondary | ICD-10-CM | POA: Diagnosis not present

## 2022-10-19 DIAGNOSIS — Z7984 Long term (current) use of oral hypoglycemic drugs: Secondary | ICD-10-CM | POA: Insufficient documentation

## 2022-10-19 DIAGNOSIS — K819 Cholecystitis, unspecified: Principal | ICD-10-CM | POA: Diagnosis present

## 2022-10-19 LAB — COMPREHENSIVE METABOLIC PANEL
ALT: 38 U/L (ref 0–44)
AST: 31 U/L (ref 15–41)
Albumin: 3.7 g/dL (ref 3.5–5.0)
Alkaline Phosphatase: 55 U/L (ref 38–126)
Anion gap: 7 (ref 5–15)
BUN: 9 mg/dL (ref 6–20)
CO2: 24 mmol/L (ref 22–32)
Calcium: 8.9 mg/dL (ref 8.9–10.3)
Chloride: 105 mmol/L (ref 98–111)
Creatinine, Ser: 0.87 mg/dL (ref 0.44–1.00)
GFR, Estimated: >60 mL/min (ref >60)
Glucose, Bld: 114 mg/dL — ABNORMAL HIGH (ref 70–99)
Potassium: 4 mmol/L (ref 3.5–5.1)
Sodium: 136 mmol/L (ref 135–145)
Total Bilirubin: 0.2 mg/dL — ABNORMAL LOW (ref 0.3–1.2)
Total Protein: 7.3 g/dL (ref 6.5–8.1)

## 2022-10-19 LAB — CBC
HCT: 42.8 % (ref 36.0–46.0)
Hemoglobin: 14.6 g/dL (ref 12.0–15.0)
MCH: 30.4 pg (ref 26.0–34.0)
MCHC: 34.1 g/dL (ref 30.0–36.0)
MCV: 89 fL (ref 80.0–100.0)
Platelets: 259 10*3/uL (ref 150–400)
RBC: 4.81 MIL/uL (ref 3.87–5.11)
RDW: 12.8 % (ref 11.5–15.5)
WBC: 7 10*3/uL (ref 4.0–10.5)
nRBC: 0 % (ref 0.0–0.2)

## 2022-10-19 LAB — URINALYSIS, ROUTINE W REFLEX MICROSCOPIC
Bilirubin Urine: NEGATIVE
Glucose, UA: NEGATIVE mg/dL
Hgb urine dipstick: NEGATIVE
Ketones, ur: NEGATIVE mg/dL
Leukocytes,Ua: NEGATIVE
Nitrite: NEGATIVE
Protein, ur: NEGATIVE mg/dL
Specific Gravity, Urine: >=1.03 (ref 1.005–1.030)
pH: 5.5 (ref 5.0–8.0)

## 2022-10-19 LAB — TROPONIN I (HIGH SENSITIVITY): Troponin I (High Sensitivity): <2 ng/L (ref <18)

## 2022-10-19 LAB — PREGNANCY, URINE: Preg Test, Ur: NEGATIVE

## 2022-10-19 LAB — LIPASE, BLOOD: Lipase: 52 U/L — ABNORMAL HIGH (ref 11–51)

## 2022-10-19 MED ORDER — PIPERACILLIN-TAZOBACTAM 3.375 G IVPB: 3.3750 g | Freq: Three times a day (TID) | INTRAVENOUS | Status: DC

## 2022-10-19 MED ORDER — MORPHINE SULFATE (PF) 2 MG/ML IV SOLN
2.0000 mg | INTRAVENOUS | Status: DC | PRN
  Administered 2022-10-20 (×3): 2 mg via INTRAVENOUS
  Filled 2022-10-19 (×3): qty 1

## 2022-10-19 MED ORDER — OXYCODONE HCL 5 MG PO TABS
5.0000 mg | ORAL_TABLET | ORAL | Status: DC | PRN
  Administered 2022-10-19 – 2022-10-20 (×2): 5 mg via ORAL
  Administered 2022-10-20: 10 mg via ORAL
  Administered 2022-10-21 (×2): 5 mg via ORAL
  Filled 2022-10-19 (×2): qty 2
  Filled 2022-10-19 (×2): qty 1
  Filled 2022-10-19: qty 2

## 2022-10-19 MED ORDER — PIPERACILLIN-TAZOBACTAM 3.375 G IVPB
3.3750 g | Freq: Four times a day (QID) | INTRAVENOUS | Status: DC
  Administered 2022-10-19: 3.375 g via INTRAVENOUS
  Filled 2022-10-19: qty 50

## 2022-10-19 MED ORDER — KCL IN DEXTROSE-NACL 20-5-0.45 MEQ/L-%-% IV SOLN
INTRAVENOUS | Status: DC
  Filled 2022-10-19 (×3): qty 1000

## 2022-10-19 MED ORDER — PANTOPRAZOLE SODIUM 40 MG PO TBEC
40.0000 mg | DELAYED_RELEASE_TABLET | Freq: Every day | ORAL | Status: DC
  Filled 2022-10-19: qty 1

## 2022-10-19 MED ORDER — ENOXAPARIN SODIUM 40 MG/0.4ML IJ SOSY
40.0000 mg | PREFILLED_SYRINGE | INTRAMUSCULAR | Status: DC
  Administered 2022-10-19 – 2022-10-20 (×2): 40 mg via SUBCUTANEOUS
  Filled 2022-10-19 (×2): qty 0.4

## 2022-10-19 MED ORDER — ONDANSETRON HCL 4 MG/2ML IJ SOLN
4.0000 mg | Freq: Once | INTRAMUSCULAR | Status: AC
  Administered 2022-10-19: 4 mg via INTRAVENOUS
  Filled 2022-10-19: qty 2

## 2022-10-19 MED ORDER — ONDANSETRON HCL 4 MG/2ML IJ SOLN
4.0000 mg | Freq: Four times a day (QID) | INTRAMUSCULAR | Status: DC | PRN
  Administered 2022-10-19: 4 mg via INTRAVENOUS
  Filled 2022-10-19: qty 2

## 2022-10-19 MED ORDER — DIPHENHYDRAMINE HCL 50 MG/ML IJ SOLN: 25.0000 mg | Freq: Four times a day (QID) | INTRAMUSCULAR | Status: DC | PRN

## 2022-10-19 MED ORDER — MORPHINE SULFATE (PF) 2 MG/ML IV SOLN
2.0000 mg | Freq: Once | INTRAVENOUS | Status: AC
  Administered 2022-10-19: 2 mg via INTRAVENOUS
  Filled 2022-10-19: qty 1

## 2022-10-19 MED ORDER — SIMETHICONE 80 MG PO CHEW
40.0000 mg | CHEWABLE_TABLET | Freq: Four times a day (QID) | ORAL | Status: DC | PRN
  Administered 2022-10-20: 40 mg via ORAL
  Filled 2022-10-19: qty 1

## 2022-10-19 MED ORDER — DIPHENHYDRAMINE HCL 25 MG PO CAPS: 25.0000 mg | ORAL_CAPSULE | Freq: Four times a day (QID) | ORAL | Status: DC | PRN

## 2022-10-19 MED ORDER — DOCUSATE SODIUM 100 MG PO CAPS
100.0000 mg | ORAL_CAPSULE | Freq: Two times a day (BID) | ORAL | Status: DC
  Administered 2022-10-19 – 2022-10-20 (×2): 100 mg via ORAL
  Filled 2022-10-19 (×3): qty 1

## 2022-10-19 MED ORDER — SODIUM CHLORIDE 0.9 % IV SOLN
2.0000 g | INTRAVENOUS | Status: DC
  Administered 2022-10-19: 2 g via INTRAVENOUS
  Filled 2022-10-19 (×2): qty 20

## 2022-10-19 MED ORDER — PANTOPRAZOLE SODIUM 40 MG IV SOLR
40.0000 mg | Freq: Once | INTRAVENOUS | Status: AC
  Administered 2022-10-19: 40 mg via INTRAVENOUS
  Filled 2022-10-19: qty 10

## 2022-10-19 MED ORDER — ONDANSETRON 4 MG PO TBDP: 4.0000 mg | ORAL_TABLET | Freq: Four times a day (QID) | ORAL | Status: DC | PRN

## 2022-10-19 NOTE — ED Notes (Signed)
Called [facility] for transport at 7:15.

## 2022-10-19 NOTE — Progress Notes (Deleted)
PHARMACY NOTE:  ANTIMICROBIAL RENAL DOSAGE ADJUSTMENT  Current antimicrobial regimen includes a mismatch between antimicrobial dosage and estimated renal function.  As per policy approved by the Pharmacy & Therapeutics and Medical Executive Committees, the antimicrobial dosage will be adjusted accordingly.  Current antimicrobial dosage:  Zosyn 3.375gm IV q6h  Indication: intra-abdominal infection  Renal Function:  Estimated Creatinine Clearance: 98 mL/min (by C-G formula based on SCr of 0.87 mg/dL).   Antimicrobial dosage has been changed to:  Zosyn 3.375gm IV q8h (each dose infused over 4 hours)   Thank you for allowing pharmacy to be a part of this patient's care.  Maryellen Pile, [facility] 10/19/2022 9:38 PM

## 2022-10-19 NOTE — ED Triage Notes (Signed)
Pt returns for continued abd pain (upper abd); sts she has an appt with GI for May 10, but she cannot wait until then; +nausea

## 2022-10-19 NOTE — ED Provider Notes (Signed)
[place] [facility] Provider Note   CSN: [id number] Arrival date & time: 10/19/22  1225     History  Chief Complaint  Patient presents with   Abdominal Pain    Kathleen Bryan is a 42 y.o. female, history of GERD, who presents to the ED secondary to epigastric pain, rating to the right side of her back it has been going on for the last week.  She states that she felt last Saturday gastritis in the past, but this feels little bit different.  States that it is kind of achy all over, and worse with eating food.  She states that she has become so sick that she hardly can eat anything.  She notes that she has severe nausea and wants to vomit but cannot unless she gags herself.  She states the pain is interfering with her job, because it is so bothersome.  Notes that she used to be on Pepcid but stopped taking it.  Denies any shortness of breath, chest pain, lower abdominal pain, urinary symptoms.  No relief with bowel movements.  Triggered by eating.    Home Medications Prior to Admission medications   Medication Sig Start Date End Date Taking? Authorizing Provider  amoxicillin (AMOXIL) 500 MG capsule Take 1 capsule (500 mg total) by mouth 3 (three) times daily. 09/05/17   Rolan Bucco, MD  HYDROcodone-acetaminophen (NORCO/VICODIN) 5-325 MG per tablet Take 1 tablet by mouth every 4 (four) hours as needed. 09/09/14   Ward, Layla Maw, DO  metFORMIN (GLUCOPHAGE) 500 MG tablet Take 2 tablets (1,000 mg total) by mouth daily with breakfast. 09/24/14   Dessa Phi, MD  metroNIDAZOLE (FLAGYL) 500 MG tablet Take 1 tablet (500 mg total) by mouth 2 (two) times daily. 03/03/22   Petrucelli, Samantha R, PA-C  pantoprazole (PROTONIX) 40 MG tablet Take 1 tablet (40 mg total) by mouth daily. 09/09/14   Ward, Layla Maw, DO  sucralfate (CARAFATE) 1 G tablet Take 1 tablet (1 g total) by mouth 4 (four) times daily -  with meals and at bedtime. 09/09/14   Ward, Layla Maw, DO   traMADol (ULTRAM) 50 MG tablet Take 1 tablet (50 mg total) by mouth every 6 (six) hours as needed. 05/26/18   Robinson, [place] N, PA-C      Allergies    Patient has no known allergies.    Review of Systems   Review of Systems  Gastrointestinal:  Positive for abdominal pain and nausea. Negative for vomiting.    Physical Exam Updated Vital Signs BP (!) 164/82 (BP Location: Right Arm)   Pulse (!) 57   Temp 98.3 F (36.8 C) (Oral)   Resp 18   LMP 09/13/2021 (Approximate)   SpO2 100%  Physical Exam Vitals and nursing note reviewed.  Constitutional:      General: She is not in acute distress.    Appearance: She is well-developed.  HENT:     Head: Normocephalic and atraumatic.  Eyes:     Conjunctiva/sclera: Conjunctivae normal.  Cardiovascular:     Rate and Rhythm: Normal rate and regular rhythm.     Heart sounds: No murmur heard. Pulmonary:     Effort: Pulmonary effort is normal. No respiratory distress.     Breath sounds: Normal breath sounds.  Abdominal:     Palpations: Abdomen is soft.     Tenderness: There is abdominal tenderness in the right upper quadrant, epigastric area and left upper quadrant. There is no guarding or rebound.  Musculoskeletal:        General: No swelling.     Cervical back: Neck supple.  Skin:    General: Skin is warm and dry.     Capillary Refill: Capillary refill takes less than 2 seconds.  Neurological:     Mental Status: She is alert.  Psychiatric:        Mood and Affect: Mood normal.     ED Results / Procedures / Treatments   Labs (all labs ordered are listed, but only abnormal results are displayed) Labs Reviewed  LIPASE, BLOOD - Abnormal; Notable for the following components:      Result Value   Lipase 52 (*)    All other components within normal limits  COMPREHENSIVE METABOLIC PANEL - Abnormal; Notable for the following components:   Glucose, Bld 114 (*)    Total Bilirubin 0.2 (*)    All other components within normal limits   CBC  URINALYSIS, ROUTINE W REFLEX MICROSCOPIC  PREGNANCY, URINE  TROPONIN I (HIGH SENSITIVITY)    EKG None  Radiology US Abdomen Limited RUQ (LIVER/GB)  Result Date: 10/19/2022 CLINICAL DATA:  Epigastric pain for 2 years EXAM: ULTRASOUND ABDOMEN LIMITED RIGHT UPPER QUADRANT COMPARISON:  None Available. FINDINGS: Gallbladder: Large stone in the distended gallbladder. Slight wall thickening but no adjacent fluid. The sonographer does report pain when scanning the gallbladder, Murphy's sign. Common bile duct: Diameter: 2 mm Liver: Diffuse echogenic hepatic parenchyma consistent with fatty liver infiltration. With this level of echogenicity evaluation for underlying mass lesion is limited and if needed follow-up contrast CT or MRI as clinically directed. Portal vein is patent on color Doppler imaging with normal direction of blood flow towards the liver. Other: None. IMPRESSION: Distended gallbladder with large stone there is also some mild wall thickening and a reported sonographic Murphy sign. Please correlate for clinical evidence of acute cholecystitis. If further imaging is indicated, HIDA scan may be of some benefit. Fatty liver infiltration. No biliary ductal dilatation Electronically Signed   By: Karen Kays M.D.   On: 10/19/2022 16:53   DG Chest 2 View  Result Date: 10/19/2022 CLINICAL DATA:  Epigastric pain EXAM: CHEST - 2 VIEW COMPARISON:  X-ray 07/07/2017 FINDINGS: The heart size and mediastinal contours are within normal limits. Both lungs are clear. No consolidation, pneumothorax or effusion. No edema. The visualized skeletal structures are unremarkable. Overlapping cardiac leads. IMPRESSION: No acute cardiopulmonary disease Electronically Signed   By: Karen Kays M.D.   On: 10/19/2022 15:29    Procedures Procedures    Medications Ordered in ED Medications  piperacillin-tazobactam (ZOSYN) IVPB 3.375 g (3.375 g Intravenous New Bag/Given 10/19/22 1748)  pantoprazole (PROTONIX)  injection 40 mg (40 mg Intravenous Given 10/19/22 1503)  ondansetron (ZOFRAN) injection 4 mg (4 mg Intravenous Given 10/19/22 1502)  morphine (PF) 2 MG/ML injection 2 mg (2 mg Intravenous Given 10/19/22 1704)    ED Course/ Medical Decision Making/ A&P                             Medical Decision Making Is a 42 year old female, here for epigastric pain that is been going on for the last few days, states is progressively getting worse, not feeling any better.  She does have tenderness to palpation on exam and was seen about 4 days ago, without any relief of her symptoms.  She has not been taking her Pepcid.  Given her tenderness to palpation recurrent return, we will  obtain a right upper quadrant ultrasound, to evaluate if it is gallbladder in etiology given relationship with food.  Amount and/or Complexity of Data Reviewed Labs: ordered.    Details: Mildly elevated lipase at 52, troponin <2, no evidence of transaminitis, leukocytosis Radiology: ordered.    Details: Right upper quadrant ultrasound shows thickened gallbladder, with positive Murphy sign, and a distended gallbladder with a large stone Discussion of management or test interpretation with external provider(s): Discussed with Dr. Maisie Fus, surgeon on-call, she states admit for gallbladder removal versus have patient follow-up in clinic. Discussed with pt, she would like admission.   Per Dr. Maisie Fus, admit to [place] [facility].  She states no hospitalist given lack of medical conditions.  Additionally will likely have surgery tomorrow.  Patient started on Zosyn for possible cholecystitis, will admit to Dr. Maisie Fus, admission orders placed, surgery will possibly tomorrow.  With Dr. Maisie Fus to further take care, admitted to [facility].  Requires admission given concern for infected gallbladder, with thickening, symptomatic etiology, that is refractory to conservative treatment.  Risk Prescription drug management. Decision regarding  hospitalization.    Final Clinical Impression(s) / ED Diagnoses Final diagnoses:  Cholecystitis    Rx / DC Orders ED Discharge Orders     None         Dolphus Jenny, [place]ley [place], PA 10/19/22 2001    Rondel Baton, MD 10/22/22 1018

## 2022-10-19 NOTE — H&P (Signed)
CC: Abd pain  HPI: Kathleen Bryan is an 42 y.o. female who is here for epigastric pain radiating to her back.  It has been present for the last week but got worse over the last couple days.  Pain worse with eating and associated with nausea.    Past Medical History:  Diagnosis Date   GERD (gastroesophageal reflux disease) Dx 2016    History reviewed. No pertinent surgical history.  Family History  Problem Relation Age of Onset   Diabetes Maternal Aunt     Social:  reports that she has been smoking cigarettes. She has never used smokeless tobacco. She reports that she does not drink alcohol and does not use drugs.  Allergies: No Known Allergies  Medications: I have reviewed the patient's current medications.  Results for orders placed or performed during the hospital encounter of 10/19/22 (from the past 48 hour(s))  Urinalysis, Routine w reflex microscopic -Urine, Clean Catch     Status: None   Collection Time: 10/19/22 12:48 PM  Result Value Ref Range   Color, Urine YELLOW YELLOW   APPearance CLEAR CLEAR   Specific Gravity, Urine >=1.030 1.005 - 1.030   pH 5.5 5.0 - 8.0   Glucose, UA NEGATIVE NEGATIVE mg/dL   Hgb urine dipstick NEGATIVE NEGATIVE   Bilirubin Urine NEGATIVE NEGATIVE   Ketones, ur NEGATIVE NEGATIVE mg/dL   Protein, ur NEGATIVE NEGATIVE mg/dL   Nitrite NEGATIVE NEGATIVE   Leukocytes,Ua NEGATIVE NEGATIVE    Comment: Microscopic not done on urines with negative protein, blood, leukocytes, nitrite, or glucose < 500 mg/dL. Performed at [facility], [address] [place], [place] [address]   Pregnancy, urine     Status: None   Collection Time: 10/19/22 12:48 PM  Result Value Ref Range   Preg Test, Ur NEGATIVE NEGATIVE    Comment:        THE SENSITIVITY OF THIS METHODOLOGY IS >20 mIU/mL. Performed at [facility], [address] [place], [place] [address]   Lipase, blood     Status: Abnormal   Collection Time: 10/19/22 12:54  PM  Result Value Ref Range   Lipase 52 (H) 11 - 51 U/L    Comment: Performed at [facility], [address] [place], [place] [address]  Comprehensive metabolic panel     Status: Abnormal   Collection Time: 10/19/22 12:54 PM  Result Value Ref Range   Sodium 136 135 - 145 mmol/L   Potassium 4.0 3.5 - 5.1 mmol/L   Chloride 105 98 - 111 mmol/L   CO2 24 22 - 32 mmol/L   Glucose, Bld 114 (H) 70 - 99 mg/dL    Comment: Glucose reference range applies only to samples taken after fasting for at least 8 hours.   BUN 9 6 - 20 mg/dL   Creatinine, Ser 7.82 0.44 - 1.00 mg/dL   Calcium 8.9 8.9 - 95.6 mg/dL   Total Protein 7.3 6.5 - 8.1 g/dL   Albumin 3.7 3.5 - 5.0 g/dL   AST 31 15 - 41 U/L   ALT 38 0 - 44 U/L   Alkaline Phosphatase 55 38 - 126 U/L   Total Bilirubin 0.2 (L) 0.3 - 1.2 mg/dL   GFR, Estimated >21 >30 mL/min    Comment: (NOTE) Calculated using the CKD-EPI Creatinine Equation (2021)    Anion gap 7 5 - 15    Comment: Performed at [facility], [address]  [place], [place] [address]  CBC     Status: None   Collection Time: 10/19/22 12:54 PM  Result Value Ref Range   WBC 7.0 4.0 - 10.5 K/uL   RBC 4.81 3.87 - 5.11 MIL/uL   Hemoglobin 14.6 12.0 - 15.0 g/dL   HCT 60.4 54.0 - 98.1 %   MCV 89.0 80.0 - 100.0 fL   MCH 30.4 26.0 - 34.0 pg   MCHC 34.1 30.0 - 36.0 g/dL   RDW 19.1 47.8 - 29.5 %   Platelets 259 150 - 400 K/uL   nRBC 0.0 0.0 - 0.2 %    Comment: Performed at [facility], [address] [place], [place] [address]  Troponin I (High Sensitivity)     Status: None   Collection Time: 10/19/22  2:42 PM  Result Value Ref Range   Troponin I (High Sensitivity) <2 <18 ng/L    Comment: (NOTE) Elevated high sensitivity troponin I (hsTnI) values and significant  changes across serial measurements may suggest ACS but many other  chronic and acute conditions are known to elevate hsTnI results.  Refer to the "Links" section for chest pain  algorithms and additional  guidance. Performed at [facility], [address] [place], [place] [address]     US Abdomen Limited RUQ (LIVER/GB)  Result Date: 10/19/2022 CLINICAL DATA:  Epigastric pain for 2 years EXAM: ULTRASOUND ABDOMEN LIMITED RIGHT UPPER QUADRANT COMPARISON:  None Available. FINDINGS: Gallbladder: Large stone in the distended gallbladder. Slight wall thickening but no adjacent fluid. The sonographer does report pain when scanning the gallbladder, Murphy's sign. Common bile duct: Diameter: 2 mm Liver: Diffuse echogenic hepatic parenchyma consistent with fatty liver infiltration. With this level of echogenicity evaluation for underlying mass lesion is limited and if needed follow-up contrast CT or MRI as clinically directed. Portal vein is patent on color Doppler imaging with normal direction of blood flow towards the liver. Other: None. IMPRESSION: Distended gallbladder with large stone there is also some mild wall thickening and a reported sonographic Murphy sign. Please correlate for clinical evidence of acute cholecystitis. If further imaging is indicated, HIDA scan may be of some benefit. Fatty liver infiltration. No biliary ductal dilatation Electronically Signed   By: Karen Kays M.D.   On: 10/19/2022 16:53   DG Chest 2 View  Result Date: 10/19/2022 CLINICAL DATA:  Epigastric pain EXAM: CHEST - 2 VIEW COMPARISON:  X-ray 07/07/2017 FINDINGS: The heart size and mediastinal contours are within normal limits. Both lungs are clear. No consolidation, pneumothorax or effusion. No edema. The visualized skeletal structures are unremarkable. Overlapping cardiac leads. IMPRESSION: No acute cardiopulmonary disease Electronically Signed   By: Karen Kays M.D.   On: 10/19/2022 15:29    ROS - all of the below systems have been reviewed with the patient and positives are indicated with bold text General: chills, fever or night sweats Eyes: blurry vision or double vision ENT:  epistaxis or sore throat Hematologic/Lymphatic: bleeding problems, blood clots or swollen lymph nodes Endocrine: temperature intolerance or unexpected weight changes Breast: new or changing breast lumps or nipple discharge Resp: cough, shortness of breath, or wheezing CV: chest pain or dyspnea on exertion GI: as per HPI GU: dysuria, trouble voiding, or hematuria Neuro: TIA or stroke symptoms    PE Blood pressure (!) 178/100, pulse (!) 58, temperature 98.5 F (36.9 C), temperature source Oral, resp. rate 18, last menstrual period 09/13/2021, SpO2 100 %, unknown if currently breastfeeding. Constitutional: NAD;  conversant; no deformities Eyes: Moist conjunctiva; no lid lag; anicteric; PERRL Neck: Trachea midline; no thyromegaly Lungs: Normal respiratory effort CV: RRR GI: Abd TTP RUQ MSK: Normal range of motion of extremities; no clubbing/cyanosis Psychiatric: Appropriate affect; alert and oriented x3  Results for orders placed or performed during the hospital encounter of 10/19/22 (from the past 48 hour(s))  Urinalysis, Routine w reflex microscopic -Urine, Clean Catch     Status: None   Collection Time: 10/19/22 12:48 PM  Result Value Ref Range   Color, Urine YELLOW YELLOW   APPearance CLEAR CLEAR   Specific Gravity, Urine >=1.030 1.005 - 1.030   pH 5.5 5.0 - 8.0   Glucose, UA NEGATIVE NEGATIVE mg/dL   Hgb urine dipstick NEGATIVE NEGATIVE   Bilirubin Urine NEGATIVE NEGATIVE   Ketones, ur NEGATIVE NEGATIVE mg/dL   Protein, ur NEGATIVE NEGATIVE mg/dL   Nitrite NEGATIVE NEGATIVE   Leukocytes,Ua NEGATIVE NEGATIVE    Comment: Microscopic not done on urines with negative protein, blood, leukocytes, nitrite, or glucose < 500 mg/dL. Performed at [facility], [address] [place], [place] [address]   Pregnancy, urine     Status: None   Collection Time: 10/19/22 12:48 PM  Result Value Ref Range   Preg Test, Ur NEGATIVE NEGATIVE    Comment:        THE SENSITIVITY OF  THIS METHODOLOGY IS >20 mIU/mL. Performed at [facility], [address] [place], [place] [address]   Lipase, blood     Status: Abnormal   Collection Time: 10/19/22 12:54 PM  Result Value Ref Range   Lipase 52 (H) 11 - 51 U/L    Comment: Performed at [facility], [address] [place], [place] [address]  Comprehensive metabolic panel     Status: Abnormal   Collection Time: 10/19/22 12:54 PM  Result Value Ref Range   Sodium 136 135 - 145 mmol/L   Potassium 4.0 3.5 - 5.1 mmol/L   Chloride 105 98 - 111 mmol/L   CO2 24 22 - 32 mmol/L   Glucose, Bld 114 (H) 70 - 99 mg/dL    Comment: Glucose reference range applies only to samples taken after fasting for at least 8 hours.   BUN 9 6 - 20 mg/dL   Creatinine, Ser 8.29 0.44 - 1.00 mg/dL   Calcium 8.9 8.9 - 56.2 mg/dL   Total Protein 7.3 6.5 - 8.1 g/dL   Albumin 3.7 3.5 - 5.0 g/dL   AST 31 15 - 41 U/L   ALT 38 0 - 44 U/L   Alkaline Phosphatase 55 38 - 126 U/L   Total Bilirubin 0.2 (L) 0.3 - 1.2 mg/dL   GFR, Estimated >13 >08 mL/min    Comment: (NOTE) Calculated using the CKD-EPI Creatinine Equation (2021)    Anion gap 7 5 - 15    Comment: Performed at [facility], [address] [place], [place] [address]  CBC     Status: None   Collection Time: 10/19/22 12:54 PM  Result Value Ref Range   WBC 7.0 4.0 - 10.5 K/uL   RBC 4.81 3.87 - 5.11 MIL/uL   Hemoglobin 14.6 12.0 - 15.0 g/dL   HCT 69.6 29.5 - 28.4 %   MCV 89.0 80.0 - 100.0 fL   MCH 30.4 26.0 - 34.0 pg   MCHC 34.1 30.0 - 36.0 g/dL   RDW 13.2 44.0 - 10.2 %   Platelets 259 150 -  400 K/uL   nRBC 0.0 0.0 - 0.2 %    Comment: Performed at [facility], [address] [place], [place] [address]  Troponin I (High Sensitivity)     Status: None   Collection Time: 10/19/22  2:42 PM  Result Value Ref Range   Troponin I (High Sensitivity) <2 <18 ng/L    Comment: (NOTE) Elevated high sensitivity troponin I (hsTnI) values and significant   changes across serial measurements may suggest ACS but many other  chronic and acute conditions are known to elevate hsTnI results.  Refer to the "Links" section for chest pain algorithms and additional  guidance. Performed at [facility], [address] [place], [place] [address]     US Abdomen Limited RUQ (LIVER/GB)  Result Date: 10/19/2022 CLINICAL DATA:  Epigastric pain for 2 years EXAM: ULTRASOUND ABDOMEN LIMITED RIGHT UPPER QUADRANT COMPARISON:  None Available. FINDINGS: Gallbladder: Large stone in the distended gallbladder. Slight wall thickening but no adjacent fluid. The sonographer does report pain when scanning the gallbladder, Murphy's sign. Common bile duct: Diameter: 2 mm Liver: Diffuse echogenic hepatic parenchyma consistent with fatty liver infiltration. With this level of echogenicity evaluation for underlying mass lesion is limited and if needed follow-up contrast CT or MRI as clinically directed. Portal vein is patent on color Doppler imaging with normal direction of blood flow towards the liver. Other: None. IMPRESSION: Distended gallbladder with large stone there is also some mild wall thickening and a reported sonographic Murphy sign. Please correlate for clinical evidence of acute cholecystitis. If further imaging is indicated, HIDA scan may be of some benefit. Fatty liver infiltration. No biliary ductal dilatation Electronically Signed   By: Karen Kays M.D.   On: 10/19/2022 16:53   DG Chest 2 View  Result Date: 10/19/2022 CLINICAL DATA:  Epigastric pain EXAM: CHEST - 2 VIEW COMPARISON:  X-ray 07/07/2017 FINDINGS: The heart size and mediastinal contours are within normal limits. Both lungs are clear. No consolidation, pneumothorax or effusion. No edema. The visualized skeletal structures are unremarkable. Overlapping cardiac leads. IMPRESSION: No acute cardiopulmonary disease Electronically Signed   By: Karen Kays M.D.   On: 10/19/2022 15:29      A/P: Kathleen Bryan is an 42 y.o. female with what appears to be early cholecystitis vs severe cholelithiasis.  Recommend admission, NPO, IVF's, IV ABX.  Will discuss with daytime surgical team regarding timing of OR.    Vanita Panda, MD  Colorectal and General Surgery [facility]  Total time of evaluation, examination, counseling and implementing medical decisions was 59 mins.   moderate decision making.

## 2022-10-20 ENCOUNTER — Observation Stay (HOSPITAL_BASED_OUTPATIENT_CLINIC_OR_DEPARTMENT_OTHER): Payer: Medicaid Other | Admitting: Anesthesiology

## 2022-10-20 ENCOUNTER — Encounter (HOSPITAL_COMMUNITY): Payer: Self-pay

## 2022-10-20 ENCOUNTER — Other Ambulatory Visit: Payer: Self-pay

## 2022-10-20 ENCOUNTER — Observation Stay (HOSPITAL_COMMUNITY): Payer: Medicaid Other | Admitting: Anesthesiology

## 2022-10-20 ENCOUNTER — Encounter (HOSPITAL_COMMUNITY): Admission: EM | Disposition: A | Discharge status: Home / Self Care | Payer: Self-pay | Source: Home / Self Care

## 2022-10-20 DIAGNOSIS — K810 Acute cholecystitis: Secondary | ICD-10-CM

## 2022-10-20 DIAGNOSIS — F17210 Nicotine dependence, cigarettes, uncomplicated: Secondary | ICD-10-CM | POA: Diagnosis not present

## 2022-10-20 DIAGNOSIS — E282 Polycystic ovarian syndrome: Secondary | ICD-10-CM

## 2022-10-20 HISTORY — PX: CHOLECYSTECTOMY: SHX55

## 2022-10-20 LAB — COMPREHENSIVE METABOLIC PANEL
ALT: 33 U/L (ref 0–44)
AST: 21 U/L (ref 15–41)
Albumin: 3.3 g/dL — ABNORMAL LOW (ref 3.5–5.0)
Alkaline Phosphatase: 45 U/L (ref 38–126)
Anion gap: 9 (ref 5–15)
BUN: 6 mg/dL (ref 6–20)
CO2: 23 mmol/L (ref 22–32)
Calcium: 8.3 mg/dL — ABNORMAL LOW (ref 8.9–10.3)
Chloride: 102 mmol/L (ref 98–111)
Creatinine, Ser: 0.98 mg/dL (ref 0.44–1.00)
GFR, Estimated: >60 mL/min (ref >60)
Glucose, Bld: 90 mg/dL (ref 70–99)
Potassium: 3.8 mmol/L (ref 3.5–5.1)
Sodium: 134 mmol/L — ABNORMAL LOW (ref 135–145)
Total Bilirubin: 0.4 mg/dL (ref 0.3–1.2)
Total Protein: 6.3 g/dL — ABNORMAL LOW (ref 6.5–8.1)

## 2022-10-20 LAB — CBC
HCT: 40.9 % (ref 36.0–46.0)
Hemoglobin: 13.9 g/dL (ref 12.0–15.0)
MCH: 30.7 pg (ref 26.0–34.0)
MCHC: 34 g/dL (ref 30.0–36.0)
MCV: 90.3 fL (ref 80.0–100.0)
Platelets: 207 10*3/uL (ref 150–400)
RBC: 4.53 MIL/uL (ref 3.87–5.11)
RDW: 12.9 % (ref 11.5–15.5)
WBC: 5.5 10*3/uL (ref 4.0–10.5)
nRBC: 0 % (ref 0.0–0.2)

## 2022-10-20 LAB — HIV ANTIBODY (ROUTINE TESTING W REFLEX): HIV Screen 4th Generation wRfx: NONREACTIVE

## 2022-10-20 SURGERY — LAPAROSCOPIC CHOLECYSTECTOMY
Anesthesia: General

## 2022-10-20 MED ORDER — ONDANSETRON HCL 4 MG/2ML IJ SOLN
INTRAMUSCULAR | Status: DC | PRN
  Administered 2022-10-20: 4 mg via INTRAVENOUS

## 2022-10-20 MED ORDER — ACETAMINOPHEN 10 MG/ML IV SOLN
INTRAVENOUS | Status: DC | PRN
  Administered 2022-10-20: 1000 mg via INTRAVENOUS

## 2022-10-20 MED ORDER — PROPOFOL 10 MG/ML IV BOLUS
INTRAVENOUS | Status: AC
  Filled 2022-10-20: qty 20

## 2022-10-20 MED ORDER — FENTANYL CITRATE PF 50 MCG/ML IJ SOSY
PREFILLED_SYRINGE | INTRAMUSCULAR | Status: AC
  Filled 2022-10-20: qty 2

## 2022-10-20 MED ORDER — LIDOCAINE 2% (20 MG/ML) 5 ML SYRINGE
INTRAMUSCULAR | Status: DC | PRN
  Administered 2022-10-20: 100 mg via INTRAVENOUS

## 2022-10-20 MED ORDER — IBUPROFEN 400 MG PO TABS
600.0000 mg | ORAL_TABLET | Freq: Four times a day (QID) | ORAL | Status: DC | PRN
  Administered 2022-10-20: 600 mg via ORAL
  Filled 2022-10-20: qty 1

## 2022-10-20 MED ORDER — DEXAMETHASONE SODIUM PHOSPHATE 10 MG/ML IJ SOLN
INTRAMUSCULAR | Status: DC | PRN
  Administered 2022-10-20: 10 mg via INTRAVENOUS

## 2022-10-20 MED ORDER — METHOCARBAMOL 500 MG PO TABS
750.0000 mg | ORAL_TABLET | Freq: Four times a day (QID) | ORAL | Status: DC | PRN
  Administered 2022-10-20 – 2022-10-21 (×3): 750 mg via ORAL
  Filled 2022-10-20 (×3): qty 2

## 2022-10-20 MED ORDER — ROCURONIUM BROMIDE 10 MG/ML (PF) SYRINGE
PREFILLED_SYRINGE | INTRAVENOUS | Status: DC | PRN
  Administered 2022-10-20: 60 mg via INTRAVENOUS
  Administered 2022-10-20: 10 mg via INTRAVENOUS

## 2022-10-20 MED ORDER — DOCUSATE SODIUM 100 MG PO CAPS: 100.0000 mg | ORAL_CAPSULE | Freq: Two times a day (BID) | ORAL | 0 refills | Status: AC

## 2022-10-20 MED ORDER — PROPOFOL 10 MG/ML IV BOLUS
INTRAVENOUS | Status: DC | PRN
  Administered 2022-10-20: 170 mg via INTRAVENOUS

## 2022-10-20 MED ORDER — FENTANYL CITRATE (PF) 250 MCG/5ML IJ SOLN
INTRAMUSCULAR | Status: AC
  Filled 2022-10-20: qty 5

## 2022-10-20 MED ORDER — RINGERS IRRIGATION IR SOLN
Status: DC | PRN
  Administered 2022-10-20: 1000 mL

## 2022-10-20 MED ORDER — OXYCODONE HCL 5 MG PO TABS: 5.0000 mg | ORAL_TABLET | ORAL | 0 refills | Status: AC | PRN

## 2022-10-20 MED ORDER — OXYCODONE HCL 5 MG PO TABS: 5.0000 mg | ORAL_TABLET | Freq: Once | ORAL | Status: AC | PRN

## 2022-10-20 MED ORDER — SUGAMMADEX SODIUM 200 MG/2ML IV SOLN
INTRAVENOUS | Status: DC | PRN
  Administered 2022-10-20: 200 mg via INTRAVENOUS

## 2022-10-20 MED ORDER — DEXAMETHASONE SODIUM PHOSPHATE 10 MG/ML IJ SOLN
INTRAMUSCULAR | Status: AC
  Filled 2022-10-20: qty 1

## 2022-10-20 MED ORDER — MIDAZOLAM HCL 2 MG/2ML IJ SOLN
INTRAMUSCULAR | Status: AC
  Filled 2022-10-20: qty 2

## 2022-10-20 MED ORDER — ACETAMINOPHEN 325 MG PO TABS: 650.0000 mg | ORAL_TABLET | Freq: Four times a day (QID) | ORAL | Status: DC | PRN

## 2022-10-20 MED ORDER — MEPERIDINE HCL 50 MG/ML IJ SOLN: 6.2500 mg | INTRAMUSCULAR | Status: DC | PRN

## 2022-10-20 MED ORDER — BUPIVACAINE HCL (PF) 0.25 % IJ SOLN
INTRAMUSCULAR | Status: DC | PRN
  Administered 2022-10-20: 30 mL

## 2022-10-20 MED ORDER — ONDANSETRON HCL 4 MG/2ML IJ SOLN
INTRAMUSCULAR | Status: AC
  Filled 2022-10-20: qty 2

## 2022-10-20 MED ORDER — FENTANYL CITRATE PF 50 MCG/ML IJ SOSY
PREFILLED_SYRINGE | INTRAMUSCULAR | Status: AC
  Filled 2022-10-20: qty 1

## 2022-10-20 MED ORDER — OXYCODONE HCL 5 MG/5ML PO SOLN
ORAL | Status: AC
  Filled 2022-10-20: qty 5

## 2022-10-20 MED ORDER — ROCURONIUM BROMIDE 10 MG/ML (PF) SYRINGE
PREFILLED_SYRINGE | INTRAVENOUS | Status: AC
  Filled 2022-10-20: qty 10

## 2022-10-20 MED ORDER — ACETAMINOPHEN 325 MG PO TABS: 325.0000 mg | ORAL_TABLET | ORAL | Status: DC | PRN

## 2022-10-20 MED ORDER — LACTATED RINGERS IV SOLN: INTRAVENOUS | Status: DC

## 2022-10-20 MED ORDER — FENTANYL CITRATE PF 50 MCG/ML IJ SOSY
25.0000 ug | PREFILLED_SYRINGE | INTRAMUSCULAR | Status: DC | PRN
  Administered 2022-10-20 (×3): 50 ug via INTRAVENOUS

## 2022-10-20 MED ORDER — OXYCODONE HCL 5 MG/5ML PO SOLN
5.0000 mg | Freq: Once | ORAL | Status: AC | PRN
  Administered 2022-10-20: 5 mg via ORAL

## 2022-10-20 MED ORDER — MIDAZOLAM HCL 2 MG/2ML IJ SOLN
INTRAMUSCULAR | Status: DC | PRN
  Administered 2022-10-20: 2 mg via INTRAVENOUS

## 2022-10-20 MED ORDER — LABETALOL HCL 5 MG/ML IV SOLN
INTRAVENOUS | Status: AC
  Filled 2022-10-20: qty 4

## 2022-10-20 MED ORDER — FENTANYL CITRATE (PF) 250 MCG/5ML IJ SOLN
INTRAMUSCULAR | Status: DC | PRN
  Administered 2022-10-20 (×2): 50 ug via INTRAVENOUS
  Administered 2022-10-20: 100 ug via INTRAVENOUS
  Administered 2022-10-20: 50 ug via INTRAVENOUS

## 2022-10-20 MED ORDER — ONDANSETRON HCL 4 MG/2ML IJ SOLN
4.0000 mg | Freq: Once | INTRAMUSCULAR | Status: AC | PRN
  Administered 2022-10-20: 4 mg via INTRAVENOUS

## 2022-10-20 MED ORDER — ACETAMINOPHEN 10 MG/ML IV SOLN
INTRAVENOUS | Status: AC
  Filled 2022-10-20: qty 100

## 2022-10-20 MED ORDER — 0.9 % SODIUM CHLORIDE (POUR BTL) OPTIME
TOPICAL | Status: DC | PRN
  Administered 2022-10-20: 1000 mL

## 2022-10-20 MED ORDER — ACETAMINOPHEN 160 MG/5ML PO SOLN: 325.0000 mg | ORAL | Status: DC | PRN

## 2022-10-20 MED ORDER — ORAL CARE MOUTH RINSE
15.0000 mL | Freq: Once | OROMUCOSAL | Status: AC
  Administered 2022-10-20: 15 mL via OROMUCOSAL

## 2022-10-20 MED ORDER — CHLORHEXIDINE GLUCONATE 0.12 % MT SOLN: 15.0000 mL | Freq: Once | OROMUCOSAL | Status: AC

## 2022-10-20 MED ORDER — METHOCARBAMOL 750 MG PO TABS: 750.0000 mg | ORAL_TABLET | Freq: Four times a day (QID) | ORAL | 0 refills | Status: AC | PRN

## 2022-10-20 MED ORDER — LIDOCAINE HCL (PF) 2 % IJ SOLN
INTRAMUSCULAR | Status: AC
  Filled 2022-10-20: qty 5

## 2022-10-20 MED ORDER — BUPIVACAINE HCL (PF) 0.25 % IJ SOLN
INTRAMUSCULAR | Status: AC
  Filled 2022-10-20: qty 30

## 2022-10-20 SURGICAL SUPPLY — 42 items
APPLIER CLIP 5 13 M/L LIGAMAX5 (MISCELLANEOUS) ×1
APPLIER CLIP ROT 10 11.4 M/L (STAPLE)
BAG COUNTER SPONGE SURGICOUNT (BAG) IMPLANT
BENZOIN TINCTURE PRP APPL 2/3 (GAUZE/BANDAGES/DRESSINGS) IMPLANT
BNDG ADH 1X3 SHEER STRL LF (GAUZE/BANDAGES/DRESSINGS) IMPLANT
CABLE HIGH FREQUENCY MONO STRZ (ELECTRODE) ×1 IMPLANT
CHLORAPREP W/TINT 26 (MISCELLANEOUS) ×1 IMPLANT
CLIP APPLIE 5 13 M/L LIGAMAX5 (MISCELLANEOUS) ×1 IMPLANT
CLIP APPLIE ROT 10 11.4 M/L (STAPLE) IMPLANT
CLIP LIGATING HEM O LOK PURPLE (MISCELLANEOUS) IMPLANT
CLIP LIGATING HEMO O LOK GREEN (MISCELLANEOUS) ×1 IMPLANT
COVER MAYO STAND XLG (MISCELLANEOUS) ×1 IMPLANT
COVER SURGICAL LIGHT HANDLE (MISCELLANEOUS) ×1 IMPLANT
DERMABOND ADVANCED .7 DNX12 (GAUZE/BANDAGES/DRESSINGS) IMPLANT
DRAIN CHANNEL 19F RND (DRAIN) IMPLANT
DRAPE C-ARM 42X120 X-RAY (DRAPES) IMPLANT
EVACUATOR SILICONE 100CC (DRAIN) IMPLANT
GLOVE BIOGEL PI IND STRL 7.0 (GLOVE) ×1 IMPLANT
GLOVE SURG SS PI 7.0 STRL IVOR (GLOVE) ×1 IMPLANT
GOWN STRL REUS W/ TWL LRG LVL3 (GOWN DISPOSABLE) ×1 IMPLANT
GOWN STRL REUS W/TWL LRG LVL3 (GOWN DISPOSABLE) ×1
GRASPER SUT TROCAR 14GX15 (MISCELLANEOUS) ×1 IMPLANT
IRRIG SUCT STRYKERFLOW 2 WTIP (MISCELLANEOUS) ×1
IRRIGATION SUCT STRKRFLW 2 WTP (MISCELLANEOUS) ×1 IMPLANT
KIT BASIN OR (CUSTOM PROCEDURE TRAY) ×1 IMPLANT
KIT TURNOVER KIT A (KITS) IMPLANT
POUCH RETRIEVAL ECOSAC 10 (ENDOMECHANICALS) ×1 IMPLANT
POUCH RETRIEVAL ECOSAC 10MM (ENDOMECHANICALS) ×1
SCISSORS LAP 5X35 DISP (ENDOMECHANICALS) ×1 IMPLANT
SET CHOLANGIOGRAPH MIX (MISCELLANEOUS) IMPLANT
SET TUBE SMOKE EVAC HIGH FLOW (TUBING) ×1 IMPLANT
SLEEVE Z-THREAD 5X100MM (TROCAR) ×2 IMPLANT
SPIKE FLUID TRANSFER (MISCELLANEOUS) ×1 IMPLANT
STRIP CLOSURE SKIN 1/2X4 (GAUZE/BANDAGES/DRESSINGS) IMPLANT
SUT ETHILON 2 0 PS N (SUTURE) IMPLANT
SUT MNCRL AB 4-0 PS2 18 (SUTURE) ×1 IMPLANT
SUT VICRYL 0 ENDOLOOP (SUTURE) IMPLANT
TOWEL OR 17X26 10 PK STRL BLUE (TOWEL DISPOSABLE) ×1 IMPLANT
TOWEL OR NON WOVEN STRL DISP B (DISPOSABLE) IMPLANT
TRAY LAPAROSCOPIC (CUSTOM PROCEDURE TRAY) ×1 IMPLANT
TROCAR ADV FIXATION 12X100MM (TROCAR) ×1 IMPLANT
TROCAR Z-THREAD OPTICAL 5X100M (TROCAR) ×1 IMPLANT

## 2022-10-20 NOTE — Progress Notes (Signed)
Pre Procedure note for inpatients:   Kathleen Bryan has been scheduled for Procedure(s): LAPAROSCOPIC CHOLECYSTECTOMY (N/A) today. The various methods of treatment have been discussed with the patient. After consideration of the risks, benefits and treatment options the patient has consented to the planned procedure.   The patient has been seen and labs reviewed. There are no changes in the patient's condition to prevent proceeding with the planned procedure today.  Recent labs:  Lab Results  Component Value Date   WBC 5.5 10/20/2022   HGB 13.9 10/20/2022   HCT 40.9 10/20/2022   PLT 207 10/20/2022   GLUCOSE 90 10/20/2022   ALT 33 10/20/2022   AST 21 10/20/2022   NA 134 (L) 10/20/2022   K 3.8 10/20/2022   CL 102 10/20/2022   CREATININE 0.98 10/20/2022   BUN 6 10/20/2022   CO2 23 10/20/2022    Rodman Pickle, MD 10/20/2022 9:33 AM

## 2022-10-20 NOTE — Anesthesia Preprocedure Evaluation (Addendum)
Anesthesia Evaluation  Patient identified by MRN, date of birth, ID band Patient awake    Reviewed: Allergy & Precautions, H&P , NPO status , Patient's Chart, lab work & pertinent test results  Airway Mallampati: I  TM Distance: >3 FB Neck ROM: Full    Dental no notable dental hx. (+) Teeth Intact, Dental Advisory Given, Missing,    Pulmonary neg pulmonary ROS, Current Smoker and Patient abstained from smoking.   Pulmonary exam normal breath sounds clear to auscultation       Cardiovascular Exercise Tolerance: Good negative cardio ROS Normal cardiovascular exam Rhythm:Regular Rate:Normal     Neuro/Psych negative neurological ROS  negative psych ROS   GI/Hepatic Neg liver ROS,GERD  Poorly Controlled,,  Endo/Other  negative endocrine ROS    Renal/GU negative Renal ROS  negative genitourinary   Musculoskeletal negative musculoskeletal ROS (+)    Abdominal   Peds negative pediatric ROS (+)  Hematology negative hematology ROS (+)   Anesthesia Other Findings PCOS   Reproductive/Obstetrics negative OB ROS                             Anesthesia Physical Anesthesia Plan  ASA: 2 and emergent  Anesthesia Plan: General   Post-op Pain Management: Toradol IV (intra-op)* and Ofirmev IV (intra-op)*   Induction: Intravenous  PONV Risk Score and Plan: 2 and Ondansetron, Dexamethasone and Treatment may vary due to age or medical condition  Airway Management Planned: Oral ETT  Additional Equipment: None  Intra-op Plan:   Post-operative Plan: Extubation in OR  Informed Consent: I have reviewed the patients History and Physical, chart, labs and discussed the procedure including the risks, benefits and alternatives for the proposed anesthesia with the patient or authorized representative who has indicated his/her understanding and acceptance.       Plan Discussed with: Anesthesiologist and  CRNA  Anesthesia Plan Comments: (  )        Anesthesia Quick Evaluation

## 2022-10-20 NOTE — Discharge Instructions (Signed)
[facility] ° °Please arrive at least 30 min before your appointment to complete your check in paperwork.  If you are unable to arrive 30 min prior to your appointment time we may have to cancel or reschedule you. °LAPAROSCOPIC SURGERY: POST OP INSTRUCTIONS °Always review your discharge instruction sheet given to you by the facility where your surgery was performed. °IF YOU HAVE DISABILITY OR FAMILY LEAVE FORMS, YOU MUST BRING THEM TO THE OFFICE FOR PROCESSING.   °DO NOT GIVE THEM TO YOUR DOCTOR. ° °PAIN CONTROL ° °First take acetaminophen (Tylenol) AND/or ibuprofen (Advil) to control your pain after surgery.  Follow directions on package.  Taking acetaminophen (Tylenol) and/or ibuprofen (Advil) regularly after surgery will help to control your pain and lower the amount of prescription pain medication you may need.  You should not take more than 4,000 mg (4 grams) of acetaminophen (Tylenol) in 24 hours.  You should not take ibuprofen (Advil), aleve, motrin, naprosyn or other NSAIDS if you have a history of stomach ulcers or chronic kidney disease.  °A prescription for pain medication may be given to you upon discharge.  Take your pain medication as prescribed, if you still have uncontrolled pain after taking acetaminophen (Tylenol) or ibuprofen (Advil). °Use ice packs to help control pain. °If you need a refill on your pain medication, please contact your pharmacy.  They will contact our office to request authorization. Prescriptions will not be filled after 5pm or on week-ends. ° °HOME MEDICATIONS °Take your usually prescribed medications unless otherwise directed. ° °DIET °You should follow a light diet the first few days after arrival home.  Be sure to include lots of fluids daily. Avoid fatty, fried foods.  ° °CONSTIPATION °It is common to experience some constipation after surgery and if you are taking pain medication.  Increasing fluid intake and taking a stool softener (such as Colace)  will usually help or prevent this problem from occurring.  A mild laxative (Milk of Magnesia or Miralax) should be taken according to package instructions if there are no bowel movements after 48 hours. ° °WOUND/INCISION CARE °Most patients will experience some swelling and bruising in the area of the incisions.  Ice packs will help.  Swelling and bruising can take several days to resolve.  °Unless discharge instructions indicate otherwise, follow guidelines below  °STERI-STRIPS - you may remove your outer bandages 48 hours after surgery, and you may shower at that time.  You have steri-strips (small skin tapes) in place directly over the incision.  These strips should be left on the skin for 7-10 days.   °DERMABOND/SKIN GLUE - you may shower in 24 hours.  The glue will flake off over the next 2-3 weeks. °Any sutures or staples will be removed at the office during your follow-up visit. ° °ACTIVITIES °You may resume regular (light) daily activities beginning the next day--such as daily self-care, walking, climbing stairs--gradually increasing activities as tolerated.  You may have sexual intercourse when it is comfortable.  Refrain from any heavy lifting or straining until approved by your doctor. °You may drive when you are no longer taking prescription pain medication, you can comfortably wear a seatbelt, and you can safely maneuver your car and apply brakes. ° °FOLLOW-UP °You should see your doctor in the office for a follow-up appointment approximately 2-3 weeks after your surgery.  You should have been given your post-op/follow-up appointment when your surgery was scheduled.  If you did not receive a post-op/follow-up appointment, make sure   that you call for this appointment within a day or two after you arrive home to insure a convenient appointment time. ° ° °WHEN TO CALL YOUR DOCTOR: °Fever over 101.0 °Inability to urinate °Continued bleeding from incision. °Increased pain, redness, or drainage from the  incision. °Increasing abdominal pain ° °The clinic staff is available to answer your questions during regular business hours.  Please don’t hesitate to call and ask to speak to one of the nurses for clinical concerns.  If you have a medical emergency, go to the nearest emergency room or call 911.  A surgeon from [facility] is always on call at the hospital. °[address], [address], [place], [place]  [address] ? [address] [address], [place], [place]   [address] °[phone] ? [phone] ? FAX [phone] ° ° ° ° °Managing Your Pain After Surgery Without Opioids ° ° ° °Thank you for participating in our program to help patients manage their pain after surgery without opioids. This is part of our effort to provide you with the best care possible, without exposing you or your family to the risk that opioids pose. ° °What pain can I expect after surgery? °You can expect to have some pain after surgery. This is normal. The pain is typically worse the day after surgery, and quickly begins to get better. °Many studies have found that many patients are able to manage their pain after surgery with Over-the-Counter (OTC) medications such as Tylenol and Motrin. If you have a condition that does not allow you to take Tylenol or Motrin, notify your surgical team. ° °How will I manage my pain? °The best strategy for controlling your pain after surgery is around the clock pain control with Tylenol (acetaminophen) and Motrin (ibuprofen or Advil). Alternating these medications with each other allows you to maximize your pain control. In addition to Tylenol and Motrin, you can use heating pads or ice packs on your incisions to help reduce your pain. ° °How will I alternate your regular strength over-the-counter pain medication? °You will take a dose of pain medication every three hours. °Start by taking 650 mg of Tylenol (2 pills of 325 mg) °3 hours later take 600 mg of Motrin (3 pills of 200 mg) °3 hours after  taking the Motrin take 650 mg of Tylenol °3 hours after that take 600 mg of Motrin. ° ° °- 1 - ° °See example - if your first dose of Tylenol is at 12:00 PM ° ° °12:00 PM Tylenol 650 mg (2 pills of 325 mg)  °3:00 PM Motrin 600 mg (3 pills of 200 mg)  °6:00 PM Tylenol 650 mg (2 pills of 325 mg)  °9:00 PM Motrin 600 mg (3 pills of 200 mg)  °Continue alternating every 3 hours  ° °We recommend that you follow this schedule around-the-clock for at least 3 days after surgery, or until you feel that it is no longer needed. Use the table on the last page of this handout to keep track of the medications you are taking. °Important: °Do not take more than 3000mg of Tylenol or 3200mg of Motrin in a 24-hour period. °Do not take ibuprofen/Motrin if you have a history of bleeding stomach ulcers, severe kidney disease, &/or actively taking a blood thinner ° °What if I still have pain? °If you have pain that is not controlled with the over-the-counter pain medications (Tylenol and Motrin or Advil) you might have what we call “breakthrough” pain. You will receive a prescription   for a small amount of an opioid pain medication such as Oxycodone, Tramadol, or Tylenol with Codeine. Use these opioid pills in the first 24 hours after surgery if you have breakthrough pain. Do not take more than 1 pill every 4-6 hours. ° °If you still have uncontrolled pain after using all opioid pills, don't hesitate to call our staff using the number provided. We will help make sure you are managing your pain in the best way possible, and if necessary, we can provide a prescription for additional pain medication. ° ° °Day 1   ° °Time  °Name of Medication Number of pills taken  °Amount of Acetaminophen  °Pain Level  ° °Comments  °AM PM       °AM PM       °AM PM       °AM PM       °AM PM       °AM PM       °AM PM       °AM PM       °Total Daily amount of Acetaminophen °Do not take more than  3,000 mg per day    ° ° °Day 2   ° °Time  °Name of Medication  Number of pills °taken  °Amount of Acetaminophen  °Pain Level  ° °Comments  °AM PM       °AM PM       °AM PM       °AM PM       °AM PM       °AM PM       °AM PM       °AM PM       °Total Daily amount of Acetaminophen °Do not take more than  3,000 mg per day    ° ° °Day 3   ° °Time  °Name of Medication Number of pills taken  °Amount of Acetaminophen  °Pain Level  ° °Comments  °AM PM       °AM PM       °AM PM       °AM PM       ° ° ° °AM PM       °AM PM       °AM PM       °AM PM       °Total Daily amount of Acetaminophen °Do not take more than  3,000 mg per day    ° ° °Day 4   ° °Time  °Name of Medication Number of pills taken  °Amount of Acetaminophen  °Pain Level  ° °Comments  °AM PM       °AM PM       °AM PM       °AM PM       °AM PM       °AM PM       °AM PM       °AM PM       °Total Daily amount of Acetaminophen °Do not take more than  3,000 mg per day    ° ° °Day 5   ° °Time  °Name of Medication Number °of pills taken  °Amount of Acetaminophen  °Pain Level  ° °Comments  °AM PM       °AM PM       °AM PM       °AM PM       °AM PM       °AM   PM       °AM PM       °AM PM       °Total Daily amount of Acetaminophen °Do not take more than  3,000 mg per day    ° ° ° °Day 6   ° °Time  °Name of Medication Number of pills °taken  °Amount of Acetaminophen  °Pain Level  °Comments  °AM PM       °AM PM       °AM PM       °AM PM       °AM PM       °AM PM       °AM PM       °AM PM       °Total Daily amount of Acetaminophen °Do not take more than  3,000 mg per day    ° ° °Day 7   ° °Time  °Name of Medication Number of pills taken  °Amount of Acetaminophen  °Pain Level  ° °Comments  °AM PM       °AM PM       °AM PM       °AM PM       °AM PM       °AM PM       °AM PM       °AM PM       °Total Daily amount of Acetaminophen °Do not take more than  3,000 mg per day    ° ° ° ° °For additional information about how and where to safely dispose of unused opioid °medications - https://www.morepowerfulnc.org ° °Disclaimer: This document  contains information and/or instructional materials adapted from [facility] for the typical patient with your condition. It does not replace medical advice from your health care provider because your experience may differ from that of the °typical patient. Talk to your health care provider if you have any questions about this °document, your condition or your treatment plan. °Adapted from [facility] ° °

## 2022-10-20 NOTE — Anesthesia Procedure Notes (Signed)
Procedure Name: Intubation Date/Time: 10/20/2022 11:45 AM  Performed by: Florene Route, CRNAPre-anesthesia Checklist: Patient identified, Emergency Drugs available, Suction available and Patient being monitored Patient Re-evaluated:Patient Re-evaluated prior to induction Oxygen Delivery Method: Circle system utilized Preoxygenation: Pre-oxygenation with 100% oxygen Induction Type: IV induction Ventilation: Mask ventilation without difficulty Laryngoscope Size: Miller and 2 Grade View: Grade I Tube type: Oral Tube size: 7.5 mm Number of attempts: 1 Airway Equipment and Method: Stylet and Oral airway Placement Confirmation: ETT inserted through vocal cords under direct vision, positive ETCO2 and breath sounds checked- equal and bilateral Secured at: 22 cm Tube secured with: Tape Dental Injury: Teeth and Oropharynx as per pre-operative assessment

## 2022-10-20 NOTE — Transfer of Care (Signed)
Immediate Anesthesia Transfer of Care Note  Patient: Kandis Cocking  Procedure(s) Performed: LAPAROSCOPIC CHOLECYSTECTOMY  Patient Location: PACU  Anesthesia Type:General  Level of Consciousness: drowsy  Airway & Oxygen Therapy: Patient Spontanous Breathing and Patient connected to face mask oxygen  Post-op Assessment: Report given to RN and Post -op Vital signs reviewed and stable  Post vital signs: Reviewed and stable  Last Vitals:  Vitals Value Taken Time  BP 158/87 10/20/22 1255  Temp    Pulse 84 10/20/22 1256  Resp 18 10/20/22 1256  SpO2 100 % 10/20/22 1256  Vitals shown include unvalidated device data.  Last Pain:  Vitals:   10/20/22 1001  TempSrc:   PainSc: 0-No pain         Complications: No notable events documented.

## 2022-10-20 NOTE — Op Note (Signed)
PATIENT:  Kathleen Bryan  42 y.o. female  PRE-OPERATIVE DIAGNOSIS:  ACUTE CHOLECYSTITIS  POST-OPERATIVE DIAGNOSIS:  ACUTE CHOLECYSTITIS  PROCEDURE:  Procedure(s): LAPAROSCOPIC CHOLECYSTECTOMY   SURGEON:  Arvie Villarruel, De Blanch, MD   ASSISTANT: none  ANESTHESIA:   local and general  Indications for procedure: [name] is a 41 y.o. female with symptoms of Abdominal pain and Nausea and vomiting consistent with gallbladder disease, Confirmed by ultrasound.  Description of procedure: The patient was brought into the operative suite, placed supine. Anesthesia was administered with endotracheal tube. Patient was strapped in place and foot board was secured. All pressure points were offloaded by foam padding. The patient was prepped and draped in the usual sterile fashion.  A periumbilical incision was made and optical entry was used to enter the abdomen. 2 5 mm trocars were placed on in the right lateral space on in the right subcostal space. A 12mm trocar was placed in the subxiphoid space. Marcaine was infused to the subxiphoid space and lateral upper right abdomen in the transversus abdominis plane. Next the patient was placed in reverse trendelenberg. The gallbladder appearedacutely inflamed.   The gallbladder was retracted cephalad and lateral. The peritoneum was reflected off the infundibulum working lateral to medial. The cystic duct and cystic artery were identified and further dissection revealed a critical view. The cystic duct and cystic artery were doubly clipped and ligated.   The gallbladder was removed off the liver bed with cautery. The Gallbladder was placed in a specimen bag. The gallbladder fossa was irrigated and hemostasis was applied with cautery. The gallbladder was removed via the 12mm trocar. The fascial defect was closed with interrupted 0 vicryl suture via laparoscopic trans-fascial suture passer. Pneumoperitoneum was removed, all trocar were removed. All incisions  were closed with 4-0 monocryl subcuticular stitch. The patient woke from anesthesia and was brought to PACU in stable condition. All counts were correct  Findings: acute cholecystitis  Specimen: gallbladder  Blood loss: 20 ml  Local anesthesia: 30 ml Marcaine  Complications: none  PLAN OF CARE: Admit for overnight observation  PATIENT DISPOSITION:  PACU - hemodynamically stable.  De Blanch [facility], [place]

## 2022-10-20 NOTE — Anesthesia Postprocedure Evaluation (Signed)
Anesthesia Post Note  Patient: Ollie Wiebelhaus  Procedure(s) Performed: LAPAROSCOPIC CHOLECYSTECTOMY     Patient location during evaluation: PACU Anesthesia Type: General Level of consciousness: awake and alert Pain management: pain level controlled Vital Signs Assessment: post-procedure vital signs reviewed and stable Respiratory status: spontaneous breathing, nonlabored ventilation, respiratory function stable and patient connected to nasal cannula oxygen Cardiovascular status: blood pressure returned to baseline and stable Postop Assessment: no apparent nausea or vomiting Anesthetic complications: no   No notable events documented.  Last Vitals:  Vitals:   10/20/22 1315 10/20/22 1330  BP: (!) 163/88 (!) 161/86  Pulse: 82 70  Resp: 15 (!) 22  Temp:  36.5 C  SpO2: 98% 96%    Last Pain:  Vitals:   10/20/22 1315  TempSrc:   PainSc: 7                  Raeana Blinn

## 2022-10-21 ENCOUNTER — Encounter (HOSPITAL_COMMUNITY): Payer: Self-pay | Admitting: General Surgery

## 2022-10-21 LAB — SURGICAL PATHOLOGY

## 2022-10-21 NOTE — Discharge Summary (Signed)
Physician Discharge Summary  Kathleen Bryan EAV:[id number] DOB: 1980/07/25 DOA: 10/19/2022  PCP: Patient, No Pcp Per  Admit date: 10/19/2022 Discharge date:  10/21/2022   Recommendations for Outpatient Follow-up:   (include homehealth, outpatient follow-up instructions, specific recommendations for PCP to follow-up on, etc.)   Follow-up Information     Maczis, Hedda Slade, PA-C. Go on 10/21/2022.   Specialty: General Surgery Why: 11/15/22 at 9:30 am. Please arrive 30 minutes early to complete check in, and bring photo ID and insurance card. Contact information: [address] [facility] [place] [address] [phone]                Discharge Diagnoses:  Principal Problem:   Cholecystitis   Surgical Procedure: lap chole  Discharge Condition: Good Disposition: Home  Diet recommendation: low fat   Hospital Course:  42 yo female presented to the ED with abdominal pain. She was found to have acute cholecystitis. She underwent laparoscopic cholecystectomy. She did well and was discharged home POD 1.  Discharge Instructions  Discharge Instructions     Diet - low sodium heart healthy   Complete by: As directed    Increase activity slowly   Complete by: As directed       Allergies as of 10/21/2022   No Known Allergies      Medication List     STOP taking these medications    amoxicillin 500 MG capsule Commonly known as: AMOXIL   HYDROcodone-acetaminophen 5-325 MG tablet Commonly known as: NORCO/VICODIN   metFORMIN 500 MG tablet Commonly known as: GLUCOPHAGE   metroNIDAZOLE 500 MG tablet Commonly known as: FLAGYL   traMADol 50 MG tablet Commonly known as: ULTRAM       TAKE these medications    acetaminophen 500 MG tablet Commonly known as: TYLENOL Take 500 mg by mouth every 6 (six) hours as needed for mild pain.   docusate sodium 100 MG capsule Commonly known as: COLACE Take 1 capsule (100 mg total) by  mouth 2 (two) times daily.   methocarbamol 750 MG tablet Commonly known as: ROBAXIN Take 1 tablet (750 mg total) by mouth every 6 (six) hours as needed for up to 5 days for muscle spasms (pain).   oxyCODONE 5 MG immediate release tablet Commonly known as: Oxy IR/ROXICODONE Take 1 tablet (5 mg total) by mouth every 4 (four) hours as needed for up to 5 days for moderate pain or severe pain.   pantoprazole 40 MG tablet Commonly known as: PROTONIX Take 1 tablet (40 mg total) by mouth daily.   sucralfate 1 g tablet Commonly known as: Carafate Take 1 tablet (1 g total) by mouth 4 (four) times daily -  with meals and at bedtime.        Follow-up Information     Maczis, Hedda Slade, [place]. Go on 10/21/2022.   Specialty: General Surgery Why: 11/15/22 at 9:30 am. Please arrive 30 minutes early to complete check in, and bring photo ID and insurance card. Contact information: [address] [facility] [place] [address] [phone]                  The results of significant diagnostics from this hospitalization (including imaging, microbiology, ancillary and laboratory) are listed below for reference.    Significant Diagnostic Studies: US Abdomen Limited RUQ (LIVER/GB)  Result Date: 10/19/2022 CLINICAL DATA:  Epigastric pain for 2 years EXAM: ULTRASOUND ABDOMEN LIMITED RIGHT UPPER QUADRANT COMPARISON:  None Available.  FINDINGS: Gallbladder: Large stone in the distended gallbladder. Slight wall thickening but no adjacent fluid. The sonographer does report pain when scanning the gallbladder, Murphy's sign. Common bile duct: Diameter: 2 mm Liver: Diffuse echogenic hepatic parenchyma consistent with fatty liver infiltration. With this level of echogenicity evaluation for underlying mass lesion is limited and if needed follow-up contrast CT or MRI as clinically directed. Portal vein is patent on color Doppler imaging with normal direction of blood flow  towards the liver. Other: None. IMPRESSION: Distended gallbladder with large stone there is also some mild wall thickening and a reported sonographic Murphy sign. Please correlate for clinical evidence of acute cholecystitis. If further imaging is indicated, HIDA scan may be of some benefit. Fatty liver infiltration. No biliary ductal dilatation Electronically Signed   By: Karen Kays M.D.   On: 10/19/2022 16:53   DG Chest 2 View  Result Date: 10/19/2022 CLINICAL DATA:  Epigastric pain EXAM: CHEST - 2 VIEW COMPARISON:  X-ray 07/07/2017 FINDINGS: The heart size and mediastinal contours are within normal limits. Both lungs are clear. No consolidation, pneumothorax or effusion. No edema. The visualized skeletal structures are unremarkable. Overlapping cardiac leads. IMPRESSION: No acute cardiopulmonary disease Electronically Signed   By: Karen Kays M.D.   On: 10/19/2022 15:29    Labs: Basic Metabolic Panel: Recent Labs  Lab 10/15/22 1646 10/19/22 1254 10/20/22 0448  NA 135 136 134*  K 4.3 4.0 3.8  CL 104 105 102  CO2 23 24 23   GLUCOSE 87 114* 90  BUN 7 9 6   CREATININE 0.97 0.87 0.98  CALCIUM 8.4* 8.9 8.3*   Liver Function Tests: Recent Labs  Lab 10/15/22 1646 10/19/22 1254 10/20/22 0448  AST 19 31 21   ALT 14 38 33  ALKPHOS 58 55 45  BILITOT 0.5 0.2* 0.4  PROT 7.0 7.3 6.3*  ALBUMIN 3.6 3.7 3.3*    CBC: Recent Labs  Lab 10/15/22 1646 10/19/22 1254 10/20/22 0448  WBC 6.6 7.0 5.5  HGB 14.1 14.6 13.9  HCT 41.1 42.8 40.9  MCV 89.3 89.0 90.3  PLT 248 259 207    CBG: No results for input(s): "GLUCAP" in the last 168 hours.  Principal Problem:   Cholecystitis   Time coordinating discharge: 15 min

## 2022-10-21 NOTE — TOC CM/SW Note (Signed)
  Transition of Care (TOC) Screening Note   Patient Details  Name: Kathleen Bryan Date of Birth: [birth year]   Transition of Care [facility]) CM/SW Contact:    Amada Jupiter, LCSW Phone Number: 10/21/2022, 9:59 AM    Transition of Care Department [facility]) has reviewed patient and no TOC needs have been identified at this time. We will continue to monitor patient advancement through interdisciplinary progression rounds. If new patient transition needs arise, please place a TOC consult.

## 2022-10-21 NOTE — Plan of Care (Signed)

## 2023-11-17 ENCOUNTER — Other Ambulatory Visit: Payer: Self-pay

## 2023-11-17 ENCOUNTER — Emergency Department (HOSPITAL_BASED_OUTPATIENT_CLINIC_OR_DEPARTMENT_OTHER)
Admission: EM | Admit: 2023-11-17 | Discharge: 2023-11-18 | Disposition: A | Discharge status: Home / Self Care | Payer: Self-pay | Attending: Emergency Medicine | Admitting: Emergency Medicine

## 2023-11-17 DIAGNOSIS — Z3A00 Weeks of gestation of pregnancy not specified: Secondary | ICD-10-CM | POA: Diagnosis not present

## 2023-11-17 DIAGNOSIS — O3680X0 Pregnancy with inconclusive fetal viability, not applicable or unspecified: Secondary | ICD-10-CM

## 2023-11-17 DIAGNOSIS — O26899 Other specified pregnancy related conditions, unspecified trimester: Secondary | ICD-10-CM | POA: Insufficient documentation

## 2023-11-17 DIAGNOSIS — R202 Paresthesia of skin: Secondary | ICD-10-CM | POA: Diagnosis not present

## 2023-11-17 NOTE — ED Triage Notes (Signed)
 Pt reports numbness of right foot to the knee ongoing for the past week.  No swelling. No injury.

## 2023-11-17 NOTE — ED Provider Notes (Signed)
 [place] [facility] Provider Note   CSN: [id number] Arrival date & time: 11/17/23  2154     History  Chief Complaint  Patient presents with   Numbness    Kathleen Bryan is a 43 y.o. female.  The history is provided by the patient.   Kathleen Bryan is a 43 y.o. female who presents to the Emergency Department complaining of paresthesia.  She presents to the emergency department for evaluation of 1 week of numbness and altered sensation to the right leg from the knee to the foot.  She describes it as a feeling of decreased sensation but also with sensation of discomfort like there is about to be a cramp in the area.  She is not weak.  She did have low back pain about 1-1/2 weeks ago but it has completely resolved.  No dysuria, incontinence, abdominal pain, fever, vomiting, headache, vaginal discharge, vaginal bleeding.  She does have some nausea.  No history of back problems or injuries.  She does have a history of hypertension.  LMP was 1 month ago, but it was very light. Home Medications Prior to Admission medications   Medication Sig Start Date End Date Taking? Authorizing Provider  acetaminophen  (TYLENOL ) 500 MG tablet Take 500 mg by mouth every 6 (six) hours as needed for mild pain.    [provider]  docusate sodium  (COLACE) 100 MG capsule Take 1 capsule (100 mg total) by mouth 2 (two) times daily. 10/20/22   Elwin Hammond, PA-C  pantoprazole  (PROTONIX ) 40 MG tablet Take 1 tablet (40 mg total) by mouth daily. Patient not taking: Reported on 10/19/2022 09/09/14   Ward, Clover Dao, DO  sucralfate  (CARAFATE ) 1 G tablet Take 1 tablet (1 g total) by mouth 4 (four) times daily -  with meals and at bedtime. Patient not taking: Reported on 10/19/2022 09/09/14   Ward, Clover Dao, DO      Allergies    Patient has no known allergies.    Review of Systems   Review of Systems  All other systems reviewed and are negative.   Physical  Exam Updated Vital Signs BP (!) 167/102 (BP Location: Right Wrist)   Pulse 69   Temp 98.5 F (36.9 C) (Bladder)   Resp 18   SpO2 100%  Physical Exam Vitals and nursing note reviewed.  Constitutional:      Appearance: She is well-developed.  HENT:     Head: Normocephalic and atraumatic.  Cardiovascular:     Rate and Rhythm: Normal rate and regular rhythm.  Pulmonary:     Effort: Pulmonary effort is normal. No respiratory distress.  Abdominal:     Palpations: Abdomen is soft.     Tenderness: There is no abdominal tenderness. There is no guarding or rebound.  Musculoskeletal:        General: No tenderness.  Skin:    General: Skin is warm and dry.  Neurological:     Mental Status: She is alert and oriented to person, place, and time.     Comments: 5 out of 5 strength in all 4 extremities.  Sensation to light touch is intact in the bilateral lower extremities although it does feel different throughout the right leg from the knee to the foot.  This is anterior and posterior.  2+ patellar reflexes bilaterally.  Symmetric ankle clonus.  Psychiatric:        Behavior: Behavior normal.     ED Results / Procedures / Treatments  Labs (all labs ordered are listed, but only abnormal results are displayed) Labs Reviewed  PREGNANCY, URINE - Abnormal; Notable for the following components:      Result Value   Preg Test, Ur POSITIVE (*)    All other components within normal limits  URINALYSIS, W/ REFLEX TO CULTURE (INFECTION SUSPECTED) - Abnormal; Notable for the following components:   Bacteria, UA RARE (*)    All other components within normal limits  HCG, QUANTITATIVE, PREGNANCY - Abnormal; Notable for the following components:   hCG, Beta Chain, Quant, S 249 (*)    All other components within normal limits  BASIC METABOLIC PANEL WITH GFR  CBC WITH DIFFERENTIAL/PLATELET    EKG None  Radiology No results found.  Procedures Procedures    Medications Ordered in  ED Medications - No data to display  ED Course/ Medical Decision Making/ A&P                                 Medical Decision Making Amount and/or Complexity of Data Reviewed Labs: ordered. Radiology: ordered.   Patient here for evaluation of paresthesias to the right leg.  She does not have focal deficits on evaluation although does have altered sensation to light touch in the right leg.  She is instantly found to be pregnant today, no vaginal bleeding, abdominal pain.  Discussed that this will need further evaluation to see if this is very early pregnancy versus an abnormal pregnancy.  Current history and exam is not consistent with acute cauda equina, CVA.  Unlikely to be acute MS.  Discussed home care for paresthesias, PCP follow-up.  Ultrasound is not available at this time regarding incidental findings of pregnancy-will have patient return later today for formal ultrasound.  Plan to discharge with outpatient resources and return precautions.       Final Clinical Impression(s) / ED Diagnoses Final diagnoses:  Paresthesia  Pregnancy of unknown anatomic location    Rx / DC Orders ED Discharge Orders          Ordered    US  OB LESS THAN 14 WEEKS W/ OB TRANSVAGINAL AND DOPPLER        11/18/23 0148              Kelsey Patricia, MD 11/18/23 [phone]

## 2023-11-18 ENCOUNTER — Emergency Department (HOSPITAL_BASED_OUTPATIENT_CLINIC_OR_DEPARTMENT_OTHER): Payer: Self-pay

## 2023-11-18 ENCOUNTER — Encounter (HOSPITAL_BASED_OUTPATIENT_CLINIC_OR_DEPARTMENT_OTHER): Payer: Self-pay

## 2023-11-18 ENCOUNTER — Ambulatory Visit (HOSPITAL_BASED_OUTPATIENT_CLINIC_OR_DEPARTMENT_OTHER)
Admission: RE | Admit: 2023-11-18 | Discharge: 2023-11-18 | Disposition: A | Discharge status: Home / Self Care | Payer: Self-pay | Source: Ambulatory Visit | Attending: Emergency Medicine | Admitting: Emergency Medicine

## 2023-11-18 ENCOUNTER — Other Ambulatory Visit (HOSPITAL_BASED_OUTPATIENT_CLINIC_OR_DEPARTMENT_OTHER): Payer: Self-pay | Admitting: Emergency Medicine

## 2023-11-18 DIAGNOSIS — O3680X0 Pregnancy with inconclusive fetal viability, not applicable or unspecified: Secondary | ICD-10-CM

## 2023-11-18 DIAGNOSIS — R202 Paresthesia of skin: Secondary | ICD-10-CM

## 2023-11-18 LAB — CBC WITH DIFFERENTIAL/PLATELET
Abs Immature Granulocytes: 0.02 10*3/uL (ref 0.00–0.07)
Basophils Absolute: 0.1 10*3/uL (ref 0.0–0.1)
Basophils Relative: 1 %
Eosinophils Absolute: 0.2 10*3/uL (ref 0.0–0.5)
Eosinophils Relative: 3 %
HCT: 40.3 % (ref 36.0–46.0)
Hemoglobin: 14.2 g/dL (ref 12.0–15.0)
Immature Granulocytes: 0 %
Lymphocytes Relative: 32 %
Lymphs Abs: 2.8 10*3/uL (ref 0.7–4.0)
MCH: 31 pg (ref 26.0–34.0)
MCHC: 35.2 g/dL (ref 30.0–36.0)
MCV: 88 fL (ref 80.0–100.0)
Monocytes Absolute: 0.5 10*3/uL (ref 0.1–1.0)
Monocytes Relative: 5 %
Neutro Abs: 5.2 10*3/uL (ref 1.7–7.7)
Neutrophils Relative %: 59 %
Platelets: 248 10*3/uL (ref 150–400)
RBC: 4.58 MIL/uL (ref 3.87–5.11)
RDW: 13 % (ref 11.5–15.5)
WBC: 8.8 10*3/uL (ref 4.0–10.5)
nRBC: 0 % (ref 0.0–0.2)

## 2023-11-18 LAB — BASIC METABOLIC PANEL WITH GFR
Anion gap: 13 (ref 5–15)
BUN: 9 mg/dL (ref 6–20)
CO2: 22 mmol/L (ref 22–32)
Calcium: 8.9 mg/dL (ref 8.9–10.3)
Chloride: 103 mmol/L (ref 98–111)
Creatinine, Ser: 0.96 mg/dL (ref 0.44–1.00)
GFR, Estimated: >60 mL/min (ref >60)
Glucose, Bld: 88 mg/dL (ref 70–99)
Potassium: 3.8 mmol/L (ref 3.5–5.1)
Sodium: 137 mmol/L (ref 135–145)

## 2023-11-18 LAB — URINALYSIS, W/ REFLEX TO CULTURE (INFECTION SUSPECTED)
Bilirubin Urine: NEGATIVE
Glucose, UA: NEGATIVE mg/dL
Hgb urine dipstick: NEGATIVE
Ketones, ur: NEGATIVE mg/dL
Leukocytes,Ua: NEGATIVE
Nitrite: NEGATIVE
Protein, ur: NEGATIVE mg/dL
Specific Gravity, Urine: >=1.03 (ref 1.005–1.030)
pH: 5.5 (ref 5.0–8.0)

## 2023-11-18 LAB — PREGNANCY, URINE: Preg Test, Ur: POSITIVE — AB

## 2023-11-18 LAB — HCG, QUANTITATIVE, PREGNANCY: hCG, Beta Chain, Quant, S: 249 m[IU]/mL — ABNORMAL HIGH (ref <5)

## 2023-11-18 NOTE — ED Provider Notes (Signed)
 Pt returned to the ED today for a pelvic US .  Quant only 249.  US  reviewed by me.  I agree with the radiologist.  Probable early intrauterine gestational sac, but no yolk sac, fetal pole, or cardiac activity yet visualized. Recommend follow-up quantitative B-HCG levels and follow-up US  in 14 days to confirm and assess viability. This recommendation follows SRU consensus guidelines: Diagnostic Criteria for Nonviable Pregnancy Early in the First Trimester. Mel Spine Med 2013; 161:0960-45. 2. Suspect small subchorionic hematoma.  Pt is told to get a repeat quant in 2 days.    She is to return if worse.     Sueellen Emery, MD 11/18/23 1150

## 2024-01-10 ENCOUNTER — Emergency Department (HOSPITAL_BASED_OUTPATIENT_CLINIC_OR_DEPARTMENT_OTHER)

## 2024-01-10 ENCOUNTER — Other Ambulatory Visit: Payer: Self-pay

## 2024-01-10 ENCOUNTER — Encounter (HOSPITAL_BASED_OUTPATIENT_CLINIC_OR_DEPARTMENT_OTHER): Payer: Self-pay | Admitting: Emergency Medicine

## 2024-01-10 ENCOUNTER — Emergency Department (HOSPITAL_BASED_OUTPATIENT_CLINIC_OR_DEPARTMENT_OTHER)
Admission: EM | Admit: 2024-01-10 | Discharge: 2024-01-10 | Disposition: A | Discharge status: Home / Self Care | Attending: Emergency Medicine | Admitting: Emergency Medicine

## 2024-01-10 DIAGNOSIS — R102 Pelvic and perineal pain: Secondary | ICD-10-CM | POA: Diagnosis not present

## 2024-01-10 DIAGNOSIS — O26891 Other specified pregnancy related conditions, first trimester: Secondary | ICD-10-CM | POA: Diagnosis present

## 2024-01-10 DIAGNOSIS — Z3A12 12 weeks gestation of pregnancy: Secondary | ICD-10-CM | POA: Insufficient documentation

## 2024-01-10 DIAGNOSIS — R1032 Left lower quadrant pain: Secondary | ICD-10-CM | POA: Insufficient documentation

## 2024-01-10 DIAGNOSIS — O139 Gestational [pregnancy-induced] hypertension without significant proteinuria, unspecified trimester: Secondary | ICD-10-CM | POA: Diagnosis not present

## 2024-01-10 DIAGNOSIS — O161 Unspecified maternal hypertension, first trimester: Secondary | ICD-10-CM

## 2024-01-10 LAB — URINALYSIS, ROUTINE W REFLEX MICROSCOPIC
Bilirubin Urine: NEGATIVE
Glucose, UA: NEGATIVE mg/dL
Hgb urine dipstick: NEGATIVE
Ketones, ur: NEGATIVE mg/dL
Leukocytes,Ua: NEGATIVE
Nitrite: NEGATIVE
Protein, ur: 30 mg/dL — AB
Specific Gravity, Urine: >=1.03 (ref 1.005–1.030)
pH: 5.5 (ref 5.0–8.0)

## 2024-01-10 LAB — CBC WITH DIFFERENTIAL/PLATELET
Abs Immature Granulocytes: 0.02 K/uL (ref 0.00–0.07)
Basophils Absolute: 0 K/uL (ref 0.0–0.1)
Basophils Relative: 0 %
Eosinophils Absolute: 0.2 K/uL (ref 0.0–0.5)
Eosinophils Relative: 2 %
HCT: 35.7 % — ABNORMAL LOW (ref 36.0–46.0)
Hemoglobin: 12.7 g/dL (ref 12.0–15.0)
Immature Granulocytes: 0 %
Lymphocytes Relative: 21 %
Lymphs Abs: 1.9 K/uL (ref 0.7–4.0)
MCH: 30.9 pg (ref 26.0–34.0)
MCHC: 35.6 g/dL (ref 30.0–36.0)
MCV: 86.9 fL (ref 80.0–100.0)
Monocytes Absolute: 0.6 K/uL (ref 0.1–1.0)
Monocytes Relative: 7 %
Neutro Abs: 6.2 K/uL (ref 1.7–7.7)
Neutrophils Relative %: 70 %
Platelets: 223 K/uL (ref 150–400)
RBC: 4.11 MIL/uL (ref 3.87–5.11)
RDW: 12.5 % (ref 11.5–15.5)
WBC: 9 K/uL (ref 4.0–10.5)
nRBC: 0 % (ref 0.0–0.2)

## 2024-01-10 LAB — URINALYSIS, MICROSCOPIC (REFLEX)
RBC / HPF: NONE SEEN RBC/hpf (ref 0–5)
WBC, UA: NONE SEEN WBC/hpf (ref 0–5)

## 2024-01-10 LAB — COMPREHENSIVE METABOLIC PANEL WITH GFR
ALT: 11 U/L (ref 0–44)
AST: 15 U/L (ref 15–41)
Albumin: 3.7 g/dL (ref 3.5–5.0)
Alkaline Phosphatase: 54 U/L (ref 38–126)
Anion gap: 12 (ref 5–15)
BUN: 6 mg/dL (ref 6–20)
CO2: 20 mmol/L — ABNORMAL LOW (ref 22–32)
Calcium: 9 mg/dL (ref 8.9–10.3)
Chloride: 101 mmol/L (ref 98–111)
Creatinine, Ser: 0.65 mg/dL (ref 0.44–1.00)
GFR, Estimated: >60 mL/min (ref >60)
Glucose, Bld: 86 mg/dL (ref 70–99)
Potassium: 4.1 mmol/L (ref 3.5–5.1)
Sodium: 133 mmol/L — ABNORMAL LOW (ref 135–145)
Total Bilirubin: <0.2 mg/dL (ref 0.0–1.2)
Total Protein: 6.7 g/dL (ref 6.5–8.1)

## 2024-01-10 LAB — LIPASE, BLOOD: Lipase: 47 U/L (ref 11–51)

## 2024-01-10 MED ORDER — NIFEDIPINE ER OSMOTIC RELEASE 30 MG PO TB24: 30.0000 mg | ORAL_TABLET | Freq: Every day | ORAL | 0 refills | Status: AC

## 2024-01-10 NOTE — ED Triage Notes (Signed)
 Pt is approx [redacted] wks pregnant; c/o LLQ pain intermittently this week; denies bleeding

## 2024-01-10 NOTE — ED Provider Notes (Signed)
 [place] [facility] Provider Note   CSN: [id number] Arrival date & time: 01/10/24  1459     Patient presents with: Abdominal Pain   Kathleen Bryan is a 43 y.o. female.   43 year old female presents at around [redacted] weeks pregnant with LLQ pain, intermittent for indeterminate amount of time. Patient seen here at end of May with US  with [redacted]w[redacted]d gestational sac without fetal pole/cardiac activity. Has obtained insurance and is seeking care but difficulty getting into her OB office on her medicaid card due to balance due.  G4P0 (eclectic ab x 1, late miscarriage x 2 without identified cause). Hx of elevated BP, as been on BP meds in past, not currently. Denies vaginal bleeding or dc.        Prior to Admission medications   Medication Sig Start Date End Date Taking? Authorizing Provider  NIFEdipine  (PROCARDIA  XL) 30 MG 24 hr tablet Take 1 tablet (30 mg total) by mouth daily. 01/10/24  Yes Beverley Leita LABOR, PA-C  acetaminophen  (TYLENOL ) 500 MG tablet Take 500 mg by mouth every 6 (six) hours as needed for mild pain.    [provider]  docusate sodium  (COLACE) 100 MG capsule Take 1 capsule (100 mg total) by mouth 2 (two) times daily. 10/20/22   Rosalba Glendale DEL, PA-C  pantoprazole  (PROTONIX ) 40 MG tablet Take 1 tablet (40 mg total) by mouth daily. Patient not taking: Reported on 10/19/2022 09/09/14   Ward, Josette SAILOR, DO  sucralfate  (CARAFATE ) 1 G tablet Take 1 tablet (1 g total) by mouth 4 (four) times daily -  with meals and at bedtime. Patient not taking: Reported on 10/19/2022 09/09/14   Ward, Josette SAILOR, DO    Allergies: Patient has no known allergies.    Review of Systems Negative except as per HPI Updated Vital Signs BP (!) 150/84   Pulse 86   Temp 98.3 F (36.8 C)   Resp 17   Ht 5' 4 (1.626 m)   Wt 102.1 kg   LMP 10/20/2023 (Approximate)   SpO2 98%   BMI 38.62 kg/m   Physical Exam Vitals and nursing note reviewed.   Constitutional:      General: She is not in acute distress.    Appearance: She is well-developed. She is not diaphoretic.  HENT:     Head: Normocephalic and atraumatic.  Cardiovascular:     Rate and Rhythm: Normal rate and regular rhythm.     Heart sounds: Normal heart sounds.  Pulmonary:     Effort: Pulmonary effort is normal.     Breath sounds: Normal breath sounds.  Abdominal:     Palpations: Abdomen is soft.     Tenderness: There is no abdominal tenderness.  Skin:    General: Skin is warm and dry.     Findings: No erythema or rash.  Neurological:     Mental Status: She is alert and oriented to person, place, and time.  Psychiatric:        Behavior: Behavior normal.     (all labs ordered are listed, but only abnormal results are displayed) Labs Reviewed  CBC WITH DIFFERENTIAL/PLATELET - Abnormal; Notable for the following components:      Result Value   HCT 35.7 (*)    All other components within normal limits  COMPREHENSIVE METABOLIC PANEL WITH GFR - Abnormal; Notable for the following components:   Sodium 133 (*)    CO2 20 (*)    All other components within  normal limits  URINALYSIS, ROUTINE W REFLEX MICROSCOPIC - Abnormal; Notable for the following components:   Protein, ur 30 (*)    All other components within normal limits  URINALYSIS, MICROSCOPIC (REFLEX) - Abnormal; Notable for the following components:   Bacteria, UA RARE (*)    All other components within normal limits  LIPASE, BLOOD  GC/CHLAMYDIA PROBE AMP ([place]) NOT AT [facility]    EKG: None  Radiology: US  OB Comp < 14 Wks Result Date: 01/10/2024 CLINICAL DATA:  Left lower quadrant pain EXAM: OBSTETRIC <14 WK ULTRASOUND TECHNIQUE: Transabdominal ultrasound was performed for evaluation of the gestation as well as the maternal uterus and adnexal regions. COMPARISON:  Nov 18, 2023 FINDINGS: Intrauterine gestational sac: Single Yolk sac:  Visualized. Embryo:  Visualized. Cardiac Activity: Visualized.  Heart Rate: 166 bpm CRL:   55 mm   12 w 1 d                  US  EDC: 07/23/2024 Placenta is forming anteriorly. Subchorionic hemorrhage:  None Maternal uterus/adnexae: Unremarkable IMPRESSION: Single alive intrauterine pregnancy. No maternal adnexal lesion on ultrasound. Electronically Signed   By: Megan  Zare M.D.   On: 01/10/2024 16:44     Procedures   Medications Ordered in the ED - No data to display                                  Medical Decision Making Amount and/or Complexity of Data Reviewed Labs: ordered. Radiology: ordered.  Risk Prescription drug management.   This patient presents to the ED for concern of abdominal pain, this involves an extensive number of treatment options, and is a complaint that carries with it a high risk of complications and morbidity.  The differential diagnosis includes but not limited to colitis, ovarian cyst, IUP, UTI   Co morbidities / Chronic conditions that complicate the patient evaluation  Pregnant with out prenatal care, PCOS, gastritis, s/p cholecystectomy   Additional history obtained:  Additional history obtained from EMR External records from outside source obtained and reviewed including prior US  on file from 11/18/23 with gestational sac measuring [redacted]w[redacted]d   Lab Tests:  I Ordered, and personally interpreted labs.  The pertinent results include: CBC without significant findings.  CMP without skin findings.  Lipase within limits.  Urinalysis with small amount of protein, no evidence of urinary tract infection.  GC chlamydia sent out.   Imaging Studies ordered:  I ordered imaging studies including OB ultrasound I independently visualized and interpreted imaging which showed viable IUP at 12 weeks I agree with the radiologist interpretation   Problem List / ED Course / Critical interventions / Medication management  43 yo female with complaint of LLQ pain ongoing throughout pregnancy. Seen here 11/18/23 with US  showing [redacted]w[redacted]d  gestational sac. Patient has been unable to secure follow up, returns to the ER for general evaluation in pregnancy. US  with IUP at 12w with cardiac activity. Labs without significant findings.  States that blood pressure is high at times although when she is checking her blood pressure at home it is normal.  Discussed monitoring her blood pressure, if her blood pressure is consistently higher than 120/80, provided with prescription for nifedipine  to take while waiting to follow-up with her OB. I have reviewed the patients home medicines and have made adjustments as needed   Social Determinants of Health:  Has OB for follow up  Test / Admission - Considered:  Stable for dc      Final diagnoses:  Pelvic pain in female  [redacted] weeks gestation of pregnancy  Elevated blood pressure affecting pregnancy in first trimester, antepartum    ED Discharge Orders          Ordered    NIFEdipine  (PROCARDIA  XL) 30 MG 24 hr tablet  Daily        01/10/24 1655               Beverley Leita DELENA DEVONNA 01/10/24 2313    Lenor Hollering, MD 01/10/24 2330

## 2024-01-10 NOTE — Discharge Instructions (Addendum)
 Follow up with OB. Follow up in your MyChart for your gonorrhea/chlamydia results.  Monitor your blood pressure. If your blood pressure if consistently higher than 120/80, take Nifedipine  as prescribed for elevated blood pressure.

## 2024-01-11 LAB — GC/CHLAMYDIA PROBE AMP (~~LOC~~) NOT AT ARMC
Chlamydia: NEGATIVE
Comment: NEGATIVE
Comment: NORMAL
Neisseria Gonorrhea: NEGATIVE
